# Patient Record
Sex: Female | Born: 1950 | Race: White | Hispanic: No | Marital: Married | Smoking: Former smoker
Health system: Southern US, Community
[De-identification: ages and names within clinical notes are randomized; demographics above are authoritative.]

## PROBLEM LIST (undated history)

## (undated) DIAGNOSIS — F32A Depression, unspecified: Secondary | ICD-10-CM

## (undated) DIAGNOSIS — D151 Benign neoplasm of heart: Secondary | ICD-10-CM

## (undated) DIAGNOSIS — K219 Gastro-esophageal reflux disease without esophagitis: Secondary | ICD-10-CM

## (undated) DIAGNOSIS — E119 Type 2 diabetes mellitus without complications: Secondary | ICD-10-CM

## (undated) DIAGNOSIS — I2781 Cor pulmonale (chronic): Secondary | ICD-10-CM

## (undated) DIAGNOSIS — E039 Hypothyroidism, unspecified: Secondary | ICD-10-CM

## (undated) DIAGNOSIS — F419 Anxiety disorder, unspecified: Secondary | ICD-10-CM

## (undated) DIAGNOSIS — G4733 Obstructive sleep apnea (adult) (pediatric): Secondary | ICD-10-CM

## (undated) DIAGNOSIS — J449 Chronic obstructive pulmonary disease, unspecified: Secondary | ICD-10-CM

## (undated) DIAGNOSIS — M25511 Pain in right shoulder: Secondary | ICD-10-CM

## (undated) DIAGNOSIS — I10 Essential (primary) hypertension: Secondary | ICD-10-CM

## (undated) DIAGNOSIS — F329 Major depressive disorder, single episode, unspecified: Secondary | ICD-10-CM

## (undated) DIAGNOSIS — E785 Hyperlipidemia, unspecified: Secondary | ICD-10-CM

## (undated) HISTORY — PX: KNEE ARTHROSCOPY: SUR90

## (undated) HISTORY — DX: Hypothyroidism, unspecified: E03.9

## (undated) HISTORY — DX: Major depressive disorder, single episode, unspecified: F32.9

## (undated) HISTORY — DX: Depression, unspecified: F32.A

## (undated) HISTORY — DX: Pain in right shoulder: M25.511

## (undated) HISTORY — DX: Cor pulmonale (chronic): I27.81

## (undated) HISTORY — PX: VESICOVAGINAL FISTULA CLOSURE W/ TAH: SUR271

## (undated) HISTORY — DX: Essential (primary) hypertension: I10

## (undated) HISTORY — DX: Hyperlipidemia, unspecified: E78.5

## (undated) HISTORY — DX: Chronic obstructive pulmonary disease, unspecified: J44.9

## (undated) HISTORY — DX: Obstructive sleep apnea (adult) (pediatric): G47.33

## (undated) HISTORY — DX: Morbid (severe) obesity due to excess calories: E66.01

## (undated) HISTORY — DX: Gastro-esophageal reflux disease without esophagitis: K21.9

## (undated) HISTORY — DX: Anxiety disorder, unspecified: F41.9

## (undated) HISTORY — DX: Benign neoplasm of heart: D15.1

## (undated) HISTORY — DX: Type 2 diabetes mellitus without complications: E11.9

---

## 2004-05-09 ENCOUNTER — Ambulatory Visit: Payer: Self-pay | Admitting: Family Medicine

## 2004-06-06 ENCOUNTER — Ambulatory Visit: Payer: Self-pay | Admitting: Family Medicine

## 2005-02-07 ENCOUNTER — Observation Stay (HOSPITAL_COMMUNITY): Admission: RE | Admit: 2005-02-07 | Discharge: 2005-02-08 | Payer: Self-pay | Admitting: Orthopedic Surgery

## 2005-03-28 ENCOUNTER — Ambulatory Visit: Payer: Self-pay | Admitting: Family Medicine

## 2005-04-10 ENCOUNTER — Ambulatory Visit: Payer: Self-pay | Admitting: Family Medicine

## 2005-06-04 ENCOUNTER — Ambulatory Visit: Payer: Self-pay | Admitting: Family Medicine

## 2005-07-04 ENCOUNTER — Ambulatory Visit: Payer: Self-pay | Admitting: Family Medicine

## 2006-05-08 ENCOUNTER — Ambulatory Visit: Payer: Self-pay | Admitting: Family Medicine

## 2006-06-26 ENCOUNTER — Ambulatory Visit: Payer: Self-pay | Admitting: Family Medicine

## 2006-09-11 ENCOUNTER — Ambulatory Visit: Payer: Self-pay | Admitting: Family Medicine

## 2006-10-02 ENCOUNTER — Ambulatory Visit: Payer: Self-pay | Admitting: Family Medicine

## 2006-11-28 ENCOUNTER — Ambulatory Visit: Payer: Self-pay | Admitting: Critical Care Medicine

## 2006-12-26 ENCOUNTER — Ambulatory Visit: Payer: Self-pay | Admitting: Family Medicine

## 2007-10-22 ENCOUNTER — Ambulatory Visit: Payer: Self-pay | Admitting: Cardiology

## 2007-11-18 ENCOUNTER — Ambulatory Visit: Payer: Self-pay | Admitting: Cardiology

## 2008-01-13 ENCOUNTER — Ambulatory Visit: Payer: Self-pay | Admitting: Cardiology

## 2008-01-21 ENCOUNTER — Ambulatory Visit: Payer: Self-pay | Admitting: Cardiovascular Disease

## 2008-01-21 ENCOUNTER — Ambulatory Visit (HOSPITAL_COMMUNITY): Admission: RE | Admit: 2008-01-21 | Discharge: 2008-01-21 | Payer: Self-pay | Admitting: Cardiology

## 2008-03-29 ENCOUNTER — Ambulatory Visit: Payer: Self-pay | Admitting: Cardiology

## 2008-03-30 ENCOUNTER — Encounter: Payer: Self-pay | Admitting: Cardiology

## 2008-03-31 ENCOUNTER — Encounter: Payer: Self-pay | Admitting: Cardiology

## 2008-04-02 ENCOUNTER — Inpatient Hospital Stay (HOSPITAL_COMMUNITY): Admission: AD | Admit: 2008-04-02 | Discharge: 2008-04-09 | Payer: Self-pay | Admitting: Cardiology

## 2008-04-02 ENCOUNTER — Ambulatory Visit: Payer: Self-pay | Admitting: Pulmonary Disease

## 2008-04-28 ENCOUNTER — Ambulatory Visit: Payer: Self-pay | Admitting: Pulmonary Disease

## 2008-04-28 DIAGNOSIS — F17200 Nicotine dependence, unspecified, uncomplicated: Secondary | ICD-10-CM

## 2008-04-28 DIAGNOSIS — J4489 Other specified chronic obstructive pulmonary disease: Secondary | ICD-10-CM | POA: Insufficient documentation

## 2008-04-28 DIAGNOSIS — G4730 Sleep apnea, unspecified: Secondary | ICD-10-CM | POA: Insufficient documentation

## 2008-04-28 DIAGNOSIS — L02619 Cutaneous abscess of unspecified foot: Secondary | ICD-10-CM

## 2008-04-28 DIAGNOSIS — E039 Hypothyroidism, unspecified: Secondary | ICD-10-CM | POA: Insufficient documentation

## 2008-04-28 DIAGNOSIS — J449 Chronic obstructive pulmonary disease, unspecified: Secondary | ICD-10-CM | POA: Insufficient documentation

## 2008-04-28 DIAGNOSIS — L03119 Cellulitis of unspecified part of limb: Secondary | ICD-10-CM

## 2008-04-28 DIAGNOSIS — I279 Pulmonary heart disease, unspecified: Secondary | ICD-10-CM | POA: Insufficient documentation

## 2008-05-11 ENCOUNTER — Ambulatory Visit: Payer: Self-pay | Admitting: Cardiology

## 2008-06-23 ENCOUNTER — Encounter: Payer: Self-pay | Admitting: Cardiology

## 2008-09-20 ENCOUNTER — Encounter: Payer: Self-pay | Admitting: Pulmonary Disease

## 2008-10-13 ENCOUNTER — Ambulatory Visit: Payer: Self-pay | Admitting: Cardiology

## 2008-11-02 ENCOUNTER — Ambulatory Visit: Payer: Self-pay | Admitting: Pulmonary Disease

## 2008-11-03 LAB — CONVERTED CEMR LAB: A-1 Antitrypsin, Ser: 174 mg/dL (ref 83–200)

## 2009-03-01 ENCOUNTER — Ambulatory Visit: Payer: Self-pay | Admitting: Pulmonary Disease

## 2009-03-30 DIAGNOSIS — I10 Essential (primary) hypertension: Secondary | ICD-10-CM | POA: Insufficient documentation

## 2009-07-27 LAB — HM DEXA SCAN

## 2009-12-08 ENCOUNTER — Ambulatory Visit: Payer: Self-pay | Admitting: Cardiology

## 2009-12-08 DIAGNOSIS — D151 Benign neoplasm of heart: Secondary | ICD-10-CM

## 2009-12-19 ENCOUNTER — Encounter: Payer: Self-pay | Admitting: Pulmonary Disease

## 2010-02-02 ENCOUNTER — Ambulatory Visit: Payer: Self-pay | Admitting: Cardiology

## 2010-02-02 ENCOUNTER — Encounter: Payer: Self-pay | Admitting: Cardiology

## 2010-08-08 NOTE — Letter (Signed)
SummaryScience writer Pulmonary Care Appointment Letter  [facility] Pulmonary  [address]   [place], [place] [address]   Phone: [phone]  Fax: [phone]    12/19/2009 MRN: [id number]  [name] [name] [address] [place], [place]  [address]  Dear Ms. [name],   Our office is attempting to contact you about an appointment.  Please call our office at [phone] to schedule this appointment with Dr.Alva.  Our registration staff is prepared to assist you with any questions you may have.    Thank you,   Nature conservation officer Pulmonary Division

## 2010-08-08 NOTE — Assessment & Plan Note (Signed)
Summary: 1 YR FU PER PATIENT  Medications Added ATACAND HCT 16-12.5 MG TABS (CANDESARTAN CILEXETIL-HCTZ) take 1 tab daily LISINOPRIL 20 MG TABS (LISINOPRIL) take 1 tab daily BYSTOLIC 10 MG TABS (NEBIVOLOL HCL) take 1 tab daily BUPROPION HCL 100 MG TABS (BUPROPION HCL) take 1 tab daily CRESTOR 10 MG TABS (ROSUVASTATIN CALCIUM) take 1 tab daily GABAPENTIN 100 MG CAPS (GABAPENTIN) take 1 tab three times a day VITAMIN D 400 UNIT CAPS (CHOLECALCIFEROL) take 1 tab daily ZYRTEC ALLERGY 10 MG CAPS (CETIRIZINE HCL) prn ADVIL 200 MG TABS (IBUPROFEN) prn ALKA-SELTZER ANTACID 500 MG CAPS (CALCIUM CARBONATE ANTACID) prn ASPIRIN 81 MG TBEC (ASPIRIN) Take one tablet by mouth daily      Allergies Added: NKDA  Visit Type:  Follow-up Referring Provider:  Dr. Cyril Mourning Primary Provider:  Elmon Else, PA-C   History of Present Illness: 60 year old woman presents for followup. She saw Mr. Shara Blazing back in April of 2010. Her history is reviewed below. She is followed by our Pulmonary Division, on oxygen at home at night.  She continues to work at [employer]. She states that she is supposed to be using her oxygen with activity, although does not do this with any regularity. She is also not using CPAP at night despite obstructive sleep apnea. He has been almost 2 years and she stopped smoking.  She denies any frank chest pain. She has NYHA class 2-3 dyspnea exertion which is chronic. She denies any recent cough. She does have chronic problems with lower extremity edema likely related to her cor pulmonale. Her weight is down from 225 pounds to 209 pounds.  She did not followup for a repeat echocardiogram after her visit last year. We discussed this today.  We also discussed her taking a baby aspirin daily given her risk factor profile.    New Orders:     1)  2-D Echocardiogram (2D Echo)    Current Medications (verified): 1)  Synthroid 100 Mcg Tabs (Levothyroxine Sodium) .Marland Kitchen.. 1 Once  Daily 2)  Protonix 40 Mg Tbec (Pantoprazole Sodium) .Marland Kitchen.. 1 Once Daily 3)  Advair Diskus 250-50 Mcg/dose Misc (Fluticasone-Salmeterol) .Marland Kitchen.. 1 Puff Two Times A Day 4)  Oxygen 2 Liters .... On Exertion and With Sleep 5)  Spiriva Handihaler 18 Mcg Caps (Tiotropium Bromide Monohydrate) .... Inhale Contents of One Capsule Daily 6)  Xopenex Hfa 45 Mcg/act Aero (Levalbuterol Tartrate) .... 2 Puffs As Needed 7)  Albuterol Sulfate (2.5 Mg/45ml) 0.083% Nebu (Albuterol Sulfate) .Marland Kitchen.. 1 in Nebulizer 4 Times A Day As Needed 8)  Xopenex 0.63 Mg/35ml Nebu (Levalbuterol Hcl) .... Four Times A Day 9)  Flonase 50 Mcg/act Susp (Fluticasone Propionate) .... 2 Sprays A Day As Needed 10)  Zoloft 25 Mg Tabs (Sertraline Hcl) .... Once Daily 11)  Savella 50 Mg Tabs (Milnacipran Hcl) .... Take 1 Tablet By Mouth Two Times A Day 12)  Furosemide 40 Mg Tabs (Furosemide) .... Take 1 Tablet By Mouth Once A Day 13)  Atacand Hct 16-12.5 Mg Tabs (Candesartan Cilexetil-Hctz) .... Take 1 Tab Daily 14)  Lisinopril 20 Mg Tabs (Lisinopril) .... Take 1 Tab Daily 15)  Bystolic 10 Mg Tabs (Nebivolol Hcl) .... Take 1 Tab Daily 16)  Bupropion Hcl 100 Mg Tabs (Bupropion Hcl) .... Take 1 Tab Daily 17)  Crestor 10 Mg Tabs (Rosuvastatin Calcium) .... Take 1 Tab Daily 18)  Gabapentin 100 Mg Caps (Gabapentin) .... Take 1 Tab Three Times A Day 19)  Vitamin D 400 Unit Caps (Cholecalciferol) .... Take 1 Tab  Daily 20)  Zyrtec Allergy 10 Mg Caps (Cetirizine Hcl) .... Prn 21)  Advil 200 Mg Tabs (Ibuprofen) .... Prn 22)  Alka-Seltzer Antacid 500 Mg Caps (Calcium Carbonate Antacid) .... Prn 23)  Aspirin 81 Mg Tbec (Aspirin) .... 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  Quit in September 2009.  Smoked 2 ppd "on and off" since age 37. Quit ETOH September 2009 Positive history of passive tobacco smoke exposure  Review of Systems       The patient complains of dyspnea on exertion and peripheral edema.  The patient denies anorexia, fever, weight gain, chest pain, syncope, prolonged cough, hemoptysis, melena, and hematochezia.         She reports an episode approximately 2 months ago that occurred when she was very busy at work. She describes having trouble with her speech and thought process at that time, although symptoms cleared up around evaluation by EMS. She did not have any further hospital evaluation. She was told that this may have been related to "anxiety." Since then she has had no similar events, no focal motor deficits. She was not wearing her oxygen at the time.  Vital Signs:  Patient profile:   60 year old female Weight:      209 pounds BMI:     36.57 Pulse rate:   99 / minute BP sitting:   153 / 98  (right arm)  Vitals Entered By: Dreama Saa, CNA (December 08, 2009 8:12 AM)  Physical Exam  Additional Exam:  Obese woman in no acute distress, not wearing oxygen. HEENT: Conjunctiva and lids normal, oropharynx with poor dentition. Neck: Supple, no carotid bruits, no elevated JVP. Lungs: Diminished breath sounds, nonlabored, no wheezing. Cardiac: Distant, regular heart sounds, no S3. Abdomen: Obese, nontender, bowel sounds present. Skin: Warm and dry. Extremities: Chronic appearing edema and venous stasis below the knees, distal pulses 1-2+. Musculoskeletal: No gross deformities. Neuropsychiatric: Alert and oriented x3, affect grossly appropriate.   EKG  Procedure date:  12/08/2009  Findings:      Sinus rhythm at 92 beats per minute with decreased R-wave progression and single PVC.  Prior Report Reviewed for MRI EXAM:  Findings: 01/27/2008 Cardiac MRI:  Final impression 1.  6 x 9 mm sessile mass in the  atrial septum.  This is most consistent with a benign fibroelastoma or papilloma. 2.  Mild left atrial enlargement. 3.  No ASD and VSD. 4.  Normal left ventricle ejection fraction 54%  Prior Report Reviewed for TE Echocardiogram:  Findings: 03/31/2008 LVEF 55-60%, severe RV dilatation, severe RV dysfunction,  moderate to severe RAE, no ASD with intact atrial septum, no intrapulmonary shunting, small papilloma or fibroelastoma on crux of atrial septum (no definate myxoma).  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 A followup echocardiogram will be arranged. Of note, the patient had no evidence of ASD or other intracardiac shunting during previous evaluation.  Orders: 2-D Echocardiogram (2D Echo)  Problem # 2:  COR PULMONALE (ICD-416.9)  Prior evidence of right ventricular dilatation with severely decreased function in the setting of COPD and obstructive sleep  apnea. Patient is followed by our pulmonary division, Dr. Vassie Loll. She is due for a followup visit. Can forward copy of her repeat echocardiogram for documentation of right-sided function, and pulmonary pressures. This may be helpful in her future management. She continues on Lasix for management of her lower extremity edema which has been chronic.  Orders: 2-D Echocardiogram (2D Echo)  Problem # 3:  HYPERTENSION, UNSPECIFIED (ICD-401.9)  Followed by Mr. Izola Price. Blood pressure is elevated today. If this trend continues, will likely need further advancement in present medications. Norvasc would also be a potential consideration if needed.  The following medications were removed from the medication list:    Avalide 150-12.5 Mg Tabs (Irbesartan-hydrochlorothiazide) .Marland Kitchen... 1 once daily Her updated medication list for this problem includes:    Furosemide 40 Mg Tabs  (Furosemide) .Marland Kitchen... Take 1 tablet by mouth once a day    Atacand Hct 16-12.5 Mg Tabs (Candesartan cilexetil-hctz) .Marland Kitchen... Take 1 tab daily    Lisinopril 20 Mg Tabs (Lisinopril) .Marland Kitchen... Take 1 tab daily    Bystolic 10 Mg Tabs (Nebivolol hcl) .Marland Kitchen... Take 1 tab daily    Aspirin 81 Mg Tbec (Aspirin) .Marland Kitchen... Take one tablet by mouth daily  Patient Instructions: 1)  Your physician wants you to follow-up in: 6 months. You will receive a reminder letter in the mail one-two months in advance. If you don't receive a letter, please call our office to schedule the follow-up appointment. 2)  Your physician has requested that you have an echocardiogram.  Echocardiography is a painless test that uses sound waves to create images of your heart. It provides your doctor with information about the size and shape of your heart and how well your heart's chambers and valves are working.  This procedure takes approximately one hour. There are no restrictions for this procedure. If the results of your test are normal or stable, you will receive a letter. If they are abnormal, the nurse will contact you by phone.  3)  Your physician discussed the risks, benefits and indications for preventive aspirin therapy. It is recommended that you start (or continue) taking 81 mg of aspirin a day.

## 2010-08-14 ENCOUNTER — Emergency Department (HOSPITAL_COMMUNITY): Payer: PRIVATE HEALTH INSURANCE

## 2010-08-14 ENCOUNTER — Inpatient Hospital Stay (HOSPITAL_COMMUNITY)
Admission: EM | Admit: 2010-08-14 | Discharge: 2010-08-21 | DRG: 193 | Disposition: A | Discharge status: Home / Self Care | Payer: PRIVATE HEALTH INSURANCE | Attending: Internal Medicine | Admitting: Internal Medicine

## 2010-08-14 DIAGNOSIS — IMO0001 Reserved for inherently not codable concepts without codable children: Secondary | ICD-10-CM | POA: Diagnosis present

## 2010-08-14 DIAGNOSIS — G4733 Obstructive sleep apnea (adult) (pediatric): Secondary | ICD-10-CM | POA: Diagnosis present

## 2010-08-14 DIAGNOSIS — J383 Other diseases of vocal cords: Secondary | ICD-10-CM | POA: Diagnosis present

## 2010-08-14 DIAGNOSIS — Z9119 Patient's noncompliance with other medical treatment and regimen: Secondary | ICD-10-CM

## 2010-08-14 DIAGNOSIS — R0982 Postnasal drip: Secondary | ICD-10-CM | POA: Diagnosis present

## 2010-08-14 DIAGNOSIS — E039 Hypothyroidism, unspecified: Secondary | ICD-10-CM | POA: Diagnosis present

## 2010-08-14 DIAGNOSIS — Z9981 Dependence on supplemental oxygen: Secondary | ICD-10-CM

## 2010-08-14 DIAGNOSIS — J189 Pneumonia, unspecified organism: Principal | ICD-10-CM | POA: Diagnosis present

## 2010-08-14 DIAGNOSIS — Z91199 Patient's noncompliance with other medical treatment and regimen due to unspecified reason: Secondary | ICD-10-CM

## 2010-08-14 DIAGNOSIS — J9620 Acute and chronic respiratory failure, unspecified whether with hypoxia or hypercapnia: Secondary | ICD-10-CM | POA: Diagnosis not present

## 2010-08-14 DIAGNOSIS — J310 Chronic rhinitis: Secondary | ICD-10-CM | POA: Diagnosis present

## 2010-08-14 DIAGNOSIS — J441 Chronic obstructive pulmonary disease with (acute) exacerbation: Secondary | ICD-10-CM | POA: Diagnosis present

## 2010-08-14 LAB — CBC
HCT: 41.8 % (ref 36.0–46.0)
Hemoglobin: 13.8 g/dL (ref 12.0–15.0)
MCHC: 33 g/dL (ref 30.0–36.0)
RBC: 4.78 MIL/uL (ref 3.87–5.11)

## 2010-08-14 LAB — BASIC METABOLIC PANEL
BUN: 7 mg/dL (ref 6–23)
CO2: 34 mEq/L — ABNORMAL HIGH (ref 19–32)
Calcium: 9.4 mg/dL (ref 8.4–10.5)
GFR calc Af Amer: >60 mL/min (ref >60)
Sodium: 141 mEq/L (ref 135–145)

## 2010-08-14 LAB — DIFFERENTIAL
Basophils Absolute: 0 10*3/uL (ref 0.0–0.1)
Lymphs Abs: 0.6 10*3/uL — ABNORMAL LOW (ref 0.7–4.0)
Neutrophils Relative %: 83 % — ABNORMAL HIGH (ref 43–77)

## 2010-08-14 LAB — GLUCOSE, CAPILLARY

## 2010-08-15 LAB — GLUCOSE, CAPILLARY
Glucose-Capillary: 270 mg/dL — ABNORMAL HIGH (ref 70–99)
Glucose-Capillary: 369 mg/dL — ABNORMAL HIGH (ref 70–99)

## 2010-08-15 LAB — EXPECTORATED SPUTUM ASSESSMENT W GRAM STAIN, RFLX TO RESP C

## 2010-08-15 LAB — BASIC METABOLIC PANEL
BUN: 11 mg/dL (ref 6–23)
Chloride: 97 mEq/L (ref 96–112)
GFR calc Af Amer: >60 mL/min (ref >60)
GFR calc non Af Amer: >60 mL/min (ref >60)
Glucose, Bld: 265 mg/dL — ABNORMAL HIGH (ref 70–99)
Potassium: 4 mEq/L (ref 3.5–5.1)

## 2010-08-15 LAB — CBC
Hemoglobin: 13.2 g/dL (ref 12.0–15.0)
MCHC: 32.8 g/dL (ref 30.0–36.0)
MCV: 88.2 fL (ref 78.0–100.0)
Platelets: 164 10*3/uL (ref 150–400)
RDW: 14 % (ref 11.5–15.5)
WBC: 5.8 10*3/uL (ref 4.0–10.5)

## 2010-08-16 LAB — BASIC METABOLIC PANEL
Calcium: 9.3 mg/dL (ref 8.4–10.5)
Creatinine, Ser: 0.52 mg/dL (ref 0.4–1.2)
GFR calc Af Amer: >60 mL/min (ref >60)
GFR calc non Af Amer: >60 mL/min (ref >60)
Sodium: 140 mEq/L (ref 135–145)

## 2010-08-16 LAB — GLUCOSE, CAPILLARY
Glucose-Capillary: 333 mg/dL — ABNORMAL HIGH (ref 70–99)
Glucose-Capillary: 315 mg/dL — ABNORMAL HIGH (ref 70–99)
Glucose-Capillary: 340 mg/dL — ABNORMAL HIGH (ref 70–99)

## 2010-08-16 LAB — CBC
MCH: 28.6 pg (ref 26.0–34.0)
MCHC: 32.6 g/dL (ref 30.0–36.0)
Platelets: 155 10*3/uL (ref 150–400)
RDW: 14 % (ref 11.5–15.5)

## 2010-08-17 LAB — CULTURE, RESPIRATORY W GRAM STAIN

## 2010-08-17 LAB — GLUCOSE, CAPILLARY
Glucose-Capillary: 261 mg/dL — ABNORMAL HIGH (ref 70–99)
Glucose-Capillary: 206 mg/dL — ABNORMAL HIGH (ref 70–99)

## 2010-08-18 LAB — GLUCOSE, CAPILLARY
Glucose-Capillary: 84 mg/dL (ref 70–99)
Glucose-Capillary: 289 mg/dL — ABNORMAL HIGH (ref 70–99)

## 2010-08-18 LAB — BASIC METABOLIC PANEL
CO2: 38 mEq/L — ABNORMAL HIGH (ref 19–32)
Calcium: 9.5 mg/dL (ref 8.4–10.5)
Creatinine, Ser: 0.55 mg/dL (ref 0.4–1.2)
GFR calc Af Amer: >60 mL/min (ref >60)
GFR calc non Af Amer: >60 mL/min (ref >60)
Sodium: 143 mEq/L (ref 135–145)

## 2010-08-18 LAB — BRAIN NATRIURETIC PEPTIDE: Pro B Natriuretic peptide (BNP): 64.1 pg/mL (ref 0.0–100.0)

## 2010-08-19 DIAGNOSIS — J441 Chronic obstructive pulmonary disease with (acute) exacerbation: Secondary | ICD-10-CM

## 2010-08-19 LAB — BASIC METABOLIC PANEL
CO2: 44 mEq/L (ref 19–32)
Calcium: 9.4 mg/dL (ref 8.4–10.5)
Creatinine, Ser: 0.55 mg/dL (ref 0.4–1.2)
GFR calc Af Amer: >60 mL/min (ref >60)
GFR calc non Af Amer: >60 mL/min (ref >60)
Sodium: 144 mEq/L (ref 135–145)

## 2010-08-19 LAB — GLUCOSE, CAPILLARY
Glucose-Capillary: 109 mg/dL — ABNORMAL HIGH (ref 70–99)
Glucose-Capillary: 143 mg/dL — ABNORMAL HIGH (ref 70–99)

## 2010-08-20 ENCOUNTER — Inpatient Hospital Stay (HOSPITAL_COMMUNITY): Payer: PRIVATE HEALTH INSURANCE

## 2010-08-20 LAB — GLUCOSE, CAPILLARY
Glucose-Capillary: 114 mg/dL — ABNORMAL HIGH (ref 70–99)
Glucose-Capillary: 365 mg/dL — ABNORMAL HIGH (ref 70–99)

## 2010-08-21 DIAGNOSIS — R05 Cough: Secondary | ICD-10-CM

## 2010-08-21 LAB — CULTURE, BLOOD (ROUTINE X 2)
Culture  Setup Time: 201202070121
Culture: NO GROWTH

## 2010-08-21 LAB — GLUCOSE, CAPILLARY
Glucose-Capillary: 90 mg/dL (ref 70–99)
Glucose-Capillary: 319 mg/dL — ABNORMAL HIGH (ref 70–99)

## 2010-08-22 NOTE — Consult Note (Addendum)
NAMEASIANAE, MINKLER NO.:  [id number]  MEDICAL RECORD NO.:  [id number]           PATIENT TYPE:  I  LOCATION:  1439                         FACILITY:  [facility]  PHYSICIAN:  Coralyn Helling, MD        DATE OF BIRTH:  1951/01/30  DATE OF CONSULTATION:  08/19/2010 DATE OF DISCHARGE:                                CONSULTATION   PRIMARY CARE PHYSICIAN:  Ernestina Penna, M.D.  PULMONOLOGIST:  Oretha Milch, M.D.  CHIEF COMPLAINT:  Shortness of breath.  REASON FOR CONSULTATION:  Acute chronic obstructive pulmonary disease exacerbation.  HISTORY OF PRESENT ILLNESS:  [name] is a 59-year female who has a history of severe COPD, chronic hypoxemic respiratory failure, cor pulmonale, and obstructive sleep apnea.  She is developing worsening cough with thick yellowish sputum 2-3 days prior to admission.  This was associated with sinus congestion and postnasal drip.  She is getting progressively short of breath.  She presented to her primary care physician's office, was noted to have an oxygen saturation of 70%, and chest x-ray at that time showed a possible right middle lung infiltrate. As a result, she was advised to go to [facility] for hospital admission.  Since being admitted, she has some improvement.  She still has some cough with sputum reduction but this is decreasing.  She does continue to have sinus congestion.  She complained that Advair would cause airway irritation, and she did not like using that.  She has also noted hoarseness which has been present for quite some time.  PAST MEDICAL HISTORY: 1. Significant for COPD. 2. Chronic hypoxemic respiratory failure. 3. Cor pulmonale. 4. Obstructive sleep apnea, intolerant of CPAP. 5. Hypothyroidism. 6. Diabetes mellitus. 7. Hypertension.  PAST SURGICAL HISTORY: 1. Knee arthroscopy. 2. Hysterectomy.  ALLERGIES:  She has no known drug allergies.  OUTPATIENT MEDICATIONS: 1. Kombiglyze XR  11/998 mg. 2. Lisinopril 20 mg. 3. Atacand. 4. Bystolic. 5. Synthroid 100 mcg. 6. Protonix 40 mg. 7. Crestor 10 mg. 8. Bupropion 100 mg. 9. Lorazepam 0.5 mg. 10.Neurontin 100 mg t.i.d. 11.Advair. 12.Spiriva. 13.Home nebulizer. 14.She is on 2 L to 3 L of oxygen daily.  INPATIENT MEDICATIONS: 1. Crestor 10 mg daily. 2. Neurontin 100 mg t.i.d. 3. Prednisone 60 mg daily. 4. Lisinopril 20 mg daily. 5. Levothyroxine 100 mcg daily. 6. Protonix 40 mg daily. 7. Aspirin 81 mg daily. 8. Ventolin 2.5 mg nebulizer q.6h. 9. Heparin 5000 units subcutaneous q.8h. 10.Sliding scale insulin. 11.Diltiazem 60 mg q.12h. 12.Metformin 1000 mg daily. 13.Spiriva 1 puff daily. 14.Mucinex 600 mg b.i.d. 15.Lantus insulin 30 units at bedtime. 16.Benicar 40 mg daily. 17.Hydrochlorothiazide 12.5 mg daily. 18.Avelox 400 mg daily.  FAMILY HISTORY:  Significant for her brother who had COPD.  Father had COPD and heart disease and mother had COPD.  SOCIAL HISTORY:  She is married.  She works as a Event organiser.  She has 1 child.  She quit smoking and drinking in 2009.  REVIEW OF SYSTEMS:  A 12-point review of systems is unremarkable except for stated above.  PHYSICAL EXAMINATION:  GENERAL:  She is seen in her hospital room.  She is awake and alert, does not appear to be in acute distress.  She does have somewhat hoarse quality to her voice. VITAL SIGNS:  Temperature 98.7, blood pressure 167/95, heart rate is 69, respiratory rate 20, and oxygen saturation is 98% on 2 L. HEENT:  Pupils reactive.  Extraocular movements are intact.  She has no sinus tenderness.  She has clear nasal discharge.  She has narrow nasal angles.  There is mild erythema of the posterior pharynx.  There is no oral exudate.  There is no lymphadenopathy. HEART:  S1-S2, regular rhythm.  No murmur. CHEST:  She had diminished breath sounds but there is no wheezing or rales. ABDOMEN:  Obese, soft, and nontender.  Positive bowel  sounds. EXTREMITIES:  There is no edema, cyanosis, or clubbing. NEUROLOGIC:  Cranial nerves II-XII were intact, and no focal deficits were appreciated.  She had normal strength.  IMAGING STUDIES:  Chest x-ray from August 14, 2010, showed possible right middle lung infiltrate versus atelectasis.  LABORATORY DATA:  Sodium 144, potassium 3.7, chloride 93, CO2 is 44, BUN is 10, and creatinine 0.5.  WBC is 8.1, hemoglobin is 12.6, and hematocrit is 38.7.  Platelet count is 155,000.  BNP is 67.  IMPRESSION AND PLAN: 1. Acute chronic obstructive pulmonary disease exacerbation.  She is     slowly improving.  I would continue to wean her prednisone.  I will     continue her on Spiriva and scheduled Ventolin.  I would avoid     restarting Advair for the time being due to concerns for upper     airway irritation.  She is to continue on Avelox per the     hospitalist team. 2. Atelectasis versus infiltrate on chest x-ray from August 14, 2010.  I will repeat her chest x-ray to determine if she has     clearing of this abnormality. 3. Possible vocal cord dysfunction with angiotensin-converting-enzyme     inhibitor use and chronic rhinitis with postnasal drip.  I will     stop her lisinopril and adjust her angiotensin II receptor blocker     medication.  I will also start her on a sinus regimen with Flonase. 4. Acute on chronic hypoxic respiratory failure.  We will titrate her     oxygen, keep her oxygen saturation greater than 92%. 5. History of obstructive sleep apnea.  Again, she has been intolerant     of continuous positive airway pressure therapy in the past but this     will need to be readdressed on an outpatient basis.     Coralyn Helling, MD     VS/MEDQ  D:  08/19/2010  T:  08/19/2010  Job:  [id number]  Electronically Signed by Coralyn Helling MD on 08/22/2010 11:56:59 AM

## 2010-08-28 ENCOUNTER — Telehealth (INDEPENDENT_AMBULATORY_CARE_PROVIDER_SITE_OTHER): Payer: Self-pay | Admitting: *Deleted

## 2010-09-03 NOTE — Discharge Summary (Signed)
[name] [name], HUTT NO.:  [id number]  MEDICAL RECORD NO.:  [id number]           PATIENT TYPE:  I  LOCATION:  1439                         FACILITY:  [facility]  PHYSICIAN:  [name] Scott, MD     DATE OF BIRTH:  [birth year]  DATE OF ADMISSION:  08/14/2010 DATE OF DISCHARGE:                              DISCHARGE SUMMARY   PRIMARY CARE PHYSICIAN:  [name] [name], M.D.  PULMONOLOGIST:  [name] Milch, MD.  DISCHARGE DIAGNOSES: 1. Acute exacerbation of chronic obstructive pulmonary disease. 2. Community-acquired pneumonia. 3. Uncontrolled type 2 diabetes mellitus. 4. Hypothyroidism. 5. History of cor pulmonale. 6. Uncontrolled hypertension. 7. Acute-on-chronic hypoxic respiratory failure.  Acute respiratory     failure secondary to chronic obstructive pulmonary disease     exacerbation and chronic respiratory failure secondary to chronic     obstructive pulmonary disease and obstructive sleep apnea. 8. Obstructive sleep apnea syndrome, did not tolerate CPAP. 9. Vocal cord dysfunction. 10.Rhinitis with postnasal drip.  DISCHARGE MEDICATIONS: 1. Albuterol nebulizations 0.083%, one dose inhaled q.i.d. p.r.n. for     difficulty breathing or wheezing. 2. Albuterol HFA 2 puffs inhaled q. four hourly p.r.n. for difficulty     breathing or wheezing. 3. Amlodipine 10 mg p.o. daily. 4. Enteric-coated aspirin 81 mg p.o. daily. 5. Bisoprolol 5 mg p.o. daily. 6. Flonase 2 sprays nasally daily. 7. Mucinex 600 mg p.o. b.i.d. 8. Robitussin DM 5 mL p.o. q. four hourly p.r.n. for cough. 9. NovoLog 8 units subcutaneously t.i.d. with meals. 10.Lantus 30 units subcutaneously q.h.s. 11.Metformin XR 1000 mg p.o. daily. 12.Prednisone taper as per directions. 13.Atacand HCT 16/12.5 mg p.o. daily. 14.Ativan 0.5 mg p.o. q. eight hourly p.r.n. for anxiety. 15.Crestor 10 mg p.o. q.p.m. 16.Gabapentin 100 mg p.o. t.i.d. 17.Protonix 40 mg p.o. daily. 18.Spiriva 18 mcg inhaled  daily. 19.Synthroid 100 mcg p.o. daily. 20.Wellbutrin 100 mg p.o. q.p.m.  DISCONTINUED MEDICATIONS: 1. Kombiglyze XR. 2. Lisinopril.  IMAGING AND PROCEDURES: 1. Chest x-ray on February 12.  IMPRESSION:  Improved aeration of the     right middle lobe with persistent atelectasis in the left lung base     and in the lingula.  Background of emphysema/bronchitis. 2. Chest x-ray on February 6.  IMPRESSION:  A.  Right middle lobe and     mid lingual are air space opacity.  Although this favors     atelectasis, pneumonia could causes similar appearance.  Mild     airway thickening.  PERTINENT LABS:  Blood cultures x2 from February 6, no growth final report.  Basic metabolic panel on February 11 only significant for chloride 93, bicarbonate 44, and glucose 164, otherwise within normal limits.  BNP 64.  Sputum culture normal oropharyngeal flora.  CBC within normal limits.  Magnesium 1.9, hemoglobin A1c was 11.  CONSULTATIONS:  Pulmonary MD, [name] Helling, MD.  DIET:  Diabetic diet.  ACTIVITIES:  Increase activity gradually as tolerated.  COMPLAINTS TODAY:  None.  The patient denies any dyspnea or cough.  She has a mild headache.  PHYSICAL EXAMINATION:  GENERAL:  The patient is in no obvious distress. VITAL SIGNS:  Temperature 98.5 degrees Fahrenheit,  pulse 77 per minute, respiration 18 per minute, blood pressure 128/80 mmHg, and saturating at 93% on 3 liters of oxygen.  CBG are fluctuating but mostly in the 90 to low 100 but has had one or two readings in the low 300s. RESPIRATORY SYSTEM:  Clear, although has distant breath sounds.  No increased work of breathing. CARDIOVASCULAR SYSTEM:  First and second heart sounds heard.  Regular rate and rhythm.  No JVD. ABDOMEN:  Soft and bowel sound present. CENTRAL NERVOUS SYSTEM:  The patient is awake, alert, oriented x3 with no focal deficits. EXTREMITIES:  With grade 5/5 power.  HOSPITAL COURSE:  [name] [name] is a pleasant 60 year old  female patient with history of severe O2 dependent COPD, chronic hypoxemic respiratory failure, cor pulmonale, obstructive sleep apnea, who has not tolerated her CPAP, type 2 diabetes mellitus, hypertension, who has not been fully compliant with her medications and physician followups.  She now presented with worsening productive cough, sinus congestion, postnasal drip, and worsening dyspnea.  She was found to have O2 saturation of 70% in her primary MD's office and chest x-ray showed right middle lobe infiltrate.  She was advised to come to the hospital. 1. Acute infective exacerbation of COPD.  She was admitted to the     hospital.  She was started on IV steroids, antibiotics, oxygen, and     bronchodilator nebulizations.  She made gradual improvement, but at     one time we thought that this had plateaued and was not making as     well as recovery as expected and hence the pulmonary MD's were     consulted.  They made some changes including discontinuing her ACE     inhibitors and adding Flonase.  They have seen her today and     cleared her for discharge for outpatient follow up with her primary     pulmonary MD.  They have also advised to avoid restarting Advair.     She has completed a course of antibiotics. 2. Community-acquired pneumonia.  Treated.  Recommend followup chest x-     ray in a couple of weeks. 3. Vocal cord dysfunction.  ACE inhibitors were discontinued and for     her chronic rhinitis, Flonase was added. 4. Acute-on-chronic hypoxic respiratory failure.  She is going to be     on home oxygen. 5. History of obstructive sleep apnea.  She is intolerant of the CPAP. 6. Uncontrolled type 2 diabetes mellitus as evidenced by very high     A1c.  She has been placed on insulin and is educated regarding its     use.  With the discontinuation of steroids, this may even further     improve. 7. Hypertension.  Angiotensin-converting enzyme inhibitors have been     discontinued.   She is continued on ARBs and bisoprolol does has     been reduced and Norvasc has been added.  DISPOSITION:  The patient at this time is discharged home in stable condition.  FOLLOWUP RECOMMENDATIONS: 1. With Dr. Rudi Heap.  The patient is to call for appointment to     be seen in 5-7 days from discharge. 2. With Dr. Cyril Mourning.  The patient is to call for an appointment.  Time taken in coordinating this discharge 45 minutes.     [name] Scott, MD     AH/MEDQ  D:  08/21/2010  T:  08/21/2010  Job:  [id number]  cc:   [name] [name], M.D. Fax: [phone]  [name] Milch, MD [address] [place] [address]  Electronically Signed by [name] Scott MD on 09/03/2010 10:06:51 PM

## 2010-09-05 NOTE — H&P (Signed)
NAMECRYSTIN, [name] NO.:  [id number]  MEDICAL RECORD NO.:  [id number]           PATIENT TYPE:  E  LOCATION:  WLED                         FACILITY:  [facility]  PHYSICIAN:  Lonia Blood, M.D.DATE OF BIRTH:  [birth year]  DATE OF ADMISSION:  08/14/2010 DATE OF DISCHARGE:                             HISTORY & PHYSICAL   PRIMARY CARE PHYSICIAN:  Dr. Dorinda Hill More at [facility]  CHIEF COMPLAINT:  Shortness of breath.  HISTORY OF PRESENT ILLNESS:  [name] is a pleasant 60 year old female who is admittedly noncompliant with her typical medical therapy to include oxygen which is prescribed 24 hours a day for her known severe COPD with cor pulmonale.  She reports that approximately 3 weeks ago, she was suffering severe nasal congestion accompanied by significant postnasal drip.  Over the last week, this has worsened to the point that she began to experience significant coughing with expectoration of significant amounts of thick tenacious yellowish green sputum.  She has had occasional chills at home, which she thinks were related to fevers.  Over the last 2-3 days, she had become progressively more short of breath.  She presented to her primary care physician's office today where she was found to have an O2 sat of 70% on room air. Chest x-ray at that time also suggested a possible pneumonia.  As a result, the patient was sent to the [facility] Emergency Room for evaluation.  In the Emergency Room, an x-ray has revealed a right middle lobe and less prominent jugular opacity both consistent with community- acquired pneumonia.  The patient is suffering with some respiratory difficulty but is not in the severe distress and is improved somewhat with one-time dose of Solu-Medrol.  She denies chest pain but does admit rib type pleuritic pain bilaterally which she attributes to chronic cough.  She states that she did quit smoking  approximately a year and half ago.  As noted above, however she does not consistently use her inhalers and she tends to only use her oxygen at night when she is sleeping despite the fact that she has been told that she should wear 24 hours a day.  REVIEW OF SYSTEMS:  Comprehensive review of systems is unremarkable with exception to the positive elements noted in the history of present illness above.  PAST MEDICAL HISTORY: 1. COPD.  A.  History of admission with intubation October 2009.  B.     Cor pulmonale via echo in September 2009 neck.  C.  Oxygen-     dependent, though the patient is noncompliant. 2. Hypothyroidism. 3. Diabetes mellitus type 2. 4. Hypertension. 5. Obesity. 6. Sleep apnea. 7. Small atrial fibroelastoma.  OUTPATIENT MEDICATIONS:  A complete list of the patient's outpatient medicines is reviewed.  This list is composed by the patient and is unfortunately not complete.  The information that I am given is as follows: 1. Kombiglyze XR 5 mg/1000 mg. 2. Lisinopril 20 mg. 3. Atacand HCT. 4. Bystolic. 5. Synthroid 100 mcg. 6. Protonix 40 mg. 7. Crestor 10 mg. 8. Bupropion 100 mg. 9. Lorazepam 0.5 mg. 10.Neurontin 100 mg t.i.d. 11.Advair.  12.Spiriva. 13."Neb." 14.Oxygen 2 per minute nasal cannula when sleeping and 3 liters when     awake per her report.  ALLERGIES:  No known drug allergies.  FAMILY HISTORY:  Reviewed with the patient but noncontributory to this admission.  SOCIAL HISTORY:  The patient is married.  She lives with her husband. She works as a Solicitor at [employer].  She has one healthy grown child.  She is no longer smoking tobacco.  DATA REVIEW:  CBC is actually unremarkable.  Sodium, potassium chloride, BUN, creatinine are normal.  Bicarb is elevated 34.  Serum glucose is elevated 164.  Calcium is normal.  Chest x-ray reveals right middle lobe and lingular opacities consistent with an infiltrate.  PHYSICAL EXAMINATION:  VITAL  SIGNS:  Temperature 98.3, blood pressure 163/93, heart rate 112, respiratory 24, O2 93% on 3 L per minute nasal cannula and 70% on room air. GENERAL:  Obese female in mild respiratory distress but able to complete full sentences without pausing to breathe. HEENT:  Normocephalic, atraumatic.  Pupils equal, round, and react to light and accommodation.  Extraocular muscles intact bilaterally.  OC/OP clear. NECK:  No lymphadenopathy. LUNGS:  Poor air movement throughout all fields with decreased breath sounds diffusely and mild expiratory wheezing in all auscultated fields with no focal crackles appreciated but this is likely due to very poor air movement. CARDIOVASCULAR:  Distant heart sounds but regular rate and rhythm without gallop or rub. ABDOMEN:  Overweight, soft, bowel sounds present.  No organomegaly.  No rebound or ascites. EXTREMITIES:  1+ bilateral lower extremity edema without cyanosis or clubbing. NEUROLOGIC:  Alert and oriented x4.  Cranial II-XII intact bilaterally, 5/5 strength in bilateral upper and lower extremities.  Intact sensation to touch throughout.  IMPRESSION AND PLAN: 1. Acute exacerbation of Chronic obstructive pulmonary disease - this     is likely due to a combination of her community-acquired pneumonia     and probably an acute on chronic bronchitis.  The patient will be     admitted to the acute unit.  We will monitor her on telemetry.  At     the present time, her respiratory status is stable enough, we do     not feel the step-down unit is required.  She will be dosed with     full-dose Solu-Medrol to be titrated to p.o. prednisone.  She will     be given scheduled albuterol and Atrovent nebs.  At tis time that     her steroids are being weaned, we will initiate a steroid inhaler.     The patient will also be administered oxygen 24x7 and this will be     titrated to the appropriate O2 saturation.  I have counseled the     patient on the absolute need  for very strict adherence to her     medication and oxygen regimen.  I have advised her to followup with     Dr. Britt Bottom, whom she has seen in the past in the outpatient setting,     would be very important after the time of her discharge.  We will     attempt to assist her in arranging this appointment. 2. Community-acquired pneumonia - the patient's chest x-ray does in     fact suggest a right middle lobe and lingular infiltrate.  We will     treat the patient with empiric antibiotic therapy.  Sputum culture     will be obtained. 3. Acute bronchitis -  the patient describes symptoms prior to her     severe symptoms that were suggestive of an acute bronchitis.  She     likely had an acute sinusitis 3 weeks ago at the onset of this     sick.  The patient's acute bronchitis will be adequately treated     with the same antibiotics that will be used for her community-     acquired pneumonia along with her inhaler therapies. 4. Cor pulmonale - this diagnosis was made via echocardiogram in     September 2009.  It is not clear to me if followup echo has been     accomplished since that time.  At present, however, the patient     does not appear to be significantly volume overloaded and this     appears to be reasonably well compensated.  We will simply follow     the patient clinically for now.  Should she develop volume related     issues, we will consider an echo during her hospital stay. 5. Diabetes mellitus type 2 - this appears to be poorly controlled as     evidenced by her serum glucose of 164.  We will continue her oral     therapy.  Will restrict her to a carb modified median diet.  We     will administer sliding-scale insulin in anticipation that her CBG     will climb higher in the setting of systemic steroid therapy. 6. Hypothyroidism - we will continue the patient's Synthroid.  In this     acute setting, I do not feel that a TSH would likely be reliable     and therefore I will not  check it. 7. Hypertension - we will continue the patient's ACE inhibitor for     now.  I do not wish to dose her with both ACE and ARB while she is     sick of concern for renal insufficiency.  We will follow the     patient's blood pressure trend and add other medications as needed.     I will hold her Bystolic for now due to her bronchospasm.     Lonia Blood, M.D.     JTM/MEDQ  D:  08/14/2010  T:  08/14/2010  Job:  [id number]  cc:   Ernestina Penna, M.D. Fax: [phone]  Electronically Signed by Jetty Duhamel M.D. on 09/05/2010 06:06:32 PM

## 2010-09-05 NOTE — Progress Notes (Signed)
Summary: O2 - requesting to switch DME  Phone Note Call from Patient Call back at Home Phone [phone]   Caller: Patient Call For: parrett Summary of Call: pt wants to speak to tp re: O2. pt has an rov w/ dr Vassie Loll 09/07/10 but wants to speak to tp.  Initial call taken by: Tivis Ringer, CNA,  August 28, 2010 10:05 AM  Follow-up for Phone Call        Spoke with pt.  She states that she was recently seen in the hospital by RA. Has pending followup on 09/07/10.  She is requesting to switch from [facility] to [place], and wants new order for portable o2 through [facility]. Pls advise if okay to send order.  Follow-up by: Vernie Murders,  August 28, 2010 12:18 PM  Additional Follow-up for Phone Call Additional follow up Details #1::        OK Additional Follow-up by: Comer Locket. Vassie Loll MD,  August 28, 2010 12:40 PM    Additional Follow-up for Phone Call Additional follow up Details #2::    Called, spoke with pt.  She is aware order placed to have o2 switched from [facility] to [facility].  Pt advised not have [facility] pick up o2 until [facility] has delivered o2.  She verbalized understanding of this.  Follow-up by: Gweneth Dimitri RN,  August 28, 2010 12:48 PM

## 2010-09-07 ENCOUNTER — Ambulatory Visit: Payer: PRIVATE HEALTH INSURANCE | Admitting: Pulmonary Disease

## 2010-10-04 ENCOUNTER — Ambulatory Visit: Payer: PRIVATE HEALTH INSURANCE | Admitting: Pulmonary Disease

## 2010-10-26 ENCOUNTER — Other Ambulatory Visit: Payer: Self-pay | Admitting: Orthopedic Surgery

## 2010-10-26 DIAGNOSIS — M25511 Pain in right shoulder: Secondary | ICD-10-CM

## 2010-10-31 ENCOUNTER — Encounter: Payer: Self-pay | Admitting: Pulmonary Disease

## 2010-11-01 ENCOUNTER — Ambulatory Visit: Payer: PRIVATE HEALTH INSURANCE | Admitting: Physical Therapy

## 2010-11-02 ENCOUNTER — Ambulatory Visit: Payer: PRIVATE HEALTH INSURANCE | Admitting: Pulmonary Disease

## 2010-11-08 ENCOUNTER — Ambulatory Visit
Admission: RE | Admit: 2010-11-08 | Discharge: 2010-11-08 | Disposition: A | Discharge status: Home / Self Care | Payer: PRIVATE HEALTH INSURANCE | Source: Ambulatory Visit | Attending: Orthopedic Surgery | Admitting: Orthopedic Surgery

## 2010-11-08 DIAGNOSIS — M25511 Pain in right shoulder: Secondary | ICD-10-CM

## 2010-11-09 ENCOUNTER — Ambulatory Visit: Payer: PRIVATE HEALTH INSURANCE | Attending: Orthopedic Surgery | Admitting: Physical Therapy

## 2010-11-09 DIAGNOSIS — IMO0001 Reserved for inherently not codable concepts without codable children: Secondary | ICD-10-CM | POA: Insufficient documentation

## 2010-11-09 DIAGNOSIS — M542 Cervicalgia: Secondary | ICD-10-CM | POA: Insufficient documentation

## 2010-11-09 DIAGNOSIS — M25519 Pain in unspecified shoulder: Secondary | ICD-10-CM | POA: Insufficient documentation

## 2010-11-09 DIAGNOSIS — R5381 Other malaise: Secondary | ICD-10-CM | POA: Insufficient documentation

## 2010-11-09 DIAGNOSIS — M2560 Stiffness of unspecified joint, not elsewhere classified: Secondary | ICD-10-CM | POA: Insufficient documentation

## 2010-11-21 NOTE — Assessment & Plan Note (Signed)
[facility]                          [facility] NOTE   NAME:Lagace, TALANI BRAZEE                   MRN:          [id number]  DATE:01/13/2008                            DOB:          October 18, 1950    PRIMARY CARE PHYSICIAN:  Delaney Meigs, MD   REASON FOR VISIT:  Routine followup.   HISTORY OF PRESENT ILLNESS:  Ms. Kretzschmar was seen back in April 2009.  Her history is detailed in my previous note as well as a followup  dictation from May 2009.  Her cardiac evaluation at this point has been  fairly reassuring with the exception of an abnormal echocardiographic  finding described as a mass-like area on the right atrial septum which  could potentially represent a myxoma versus simply somewhat asymmetric  lipomatous hypertrophy.  This finding is noted on a baseline of chronic  obstructive pulmonary disease with ongoing tobacco use, alcohol use,  chronic dyspnea, resting tachycardia, and chronic cough.  Baseline blood  work as detailed in my prior dictation was overall reassuring.  Our  general plan has been for a followup cardiac MRI to better understand  this atrial abnormality.  The patient has rescheduled both followup  visits as well as this study, although my understanding is that things  are finally planned for her to proceed on January 21, 2008.  She is not  reporting any new symptoms.   ALLERGIES:  No known drug allergies.   PRESENT MEDICATIONS:  1. She uses oxygen in the evenings at 2 L.  2. Advair 250/50 daily.  3. Spiriva daily.  4. Avalide 125 mg daily.  5. Singulair 10 mg daily.  6. Synthroid 0.75 mg daily.  7. Protonix 40 mg daily.  8. Lasix 20 mg up to 2 tablets daily (presently out).  9. ProAir p.r.n. as well as well as nebulizer treatments.   REVIEW OF SYSTEMS:  As described in history of present illness.  No  hemoptysis or fevers.  He states he is generally fatigued.  No  palpitations or syncope.   PHYSICAL  EXAMINATION:  VITAL SIGNS:  Blood pressure is 151/94, heart  rate 100-110, weight is 215 pounds.  GENERAL:  This is an overweight woman in no acute distress.  HEENT:  Conjunctivae are normal.  Oropharynx is clear.  NECK:  Supple.  No loud carotid bruits.  No elevated venous pressure.  LUNGS:  Diminished breath sounds throughout.  No active wheezing or  labored breathing at rest.  CARDIAC:  Regular rate and rhythm.  No S3, gallop, or pericardial rub.  ABDOMEN:  Obese and nontender.  EXTREMITIES:  Venous stasis and chronic-appearing edema, 1+.   IMPRESSION AND RECOMMENDATIONS:  1. Dyspnea on exertion and lower extremity edema, on baseline      emphysematous lung disease, ongoing tobacco abuse, hypertension,      and regular alcohol intake.  I suspect that her symptoms are      multifactorial in general.  Basic laboratory data were fairly      unrevealing and her left ventricular ejection fraction is normal at      55-60%.  Valvular  abnormalities were not an issue either.  She does      have a restrictive diastolic filling pattern and therefore, likely      an element of diastolic dysfunction is a component of her      symptomatology.  Obviously cessation of tobacco and alcohol use      would be beneficial as well as other risk factor modification      including better blood pressure control.  I have asked her to      refill her Lasix.  We will plan to see her back over the next      month.  2. Right atrial septal echogenic finding by echocardiography      suggestive of either mass or perhaps asymmetric lipomatous      hypertrophy.  She is scheduled for a cardiac MRI to better      understand this, and we can proceed from there as far as need for      further evaluation.  I discussed this with Dr. Eden Emms.  Will give      Valium pre-procedure.  I hope she will be able to be still and      maintain reasonable breath holds for the study.  If not, we could      consider a TEE.     Jonelle Sidle, MD  Electronically Signed    SGM/MedQ  DD: 01/13/2008  DT: 01/14/2008  Job #: [id number]   cc:   Delaney Meigs, M.D.

## 2010-11-21 NOTE — Assessment & Plan Note (Signed)
[facility]                          [facility] NOTE   NAME:[name] [name], [name] [name]                   MRN:          [id number]  DATE:10/13/2008                            DOB:          1951/03/09    PRIMARY CARDIOLOGIST:  Jonelle Sidle, MD   REASON FOR VISIT:  Scheduled followup.   [name] [name] returns to our clinic for ongoing monitoring of cor  pulmonale, with normal left ventricular function, and no documented  history of coronary artery disease.  Since last seen here in the clinic  in November of last year, she appears to be stable from a cardiovascular  standpoint, with no interim development of signs/symptoms suggestive of  congestive heart failure.  Most recently, she was treated with  antibiotics for possible pneumonia, which appears to have improved  significantly.   She does use supplemental oxygen as needed, and quit smoking tobacco in  September of last year.  She is due to follow up with her pulmonologist,  Dr. Vassie Loll, later this month.   [name] [name] can climb a full flight of stairs, but with a significant  element of shortness of breath upon completion.  She denies any  exertional angina pectoris.  She also appears to have significant reflux  symptoms, and also suggests dysphasia when drinking liquids.  She denies  any prior GI evaluation, including upper endoscopy.   CURRENT MEDICATIONS:  1. Lasix 40 daily.  2. Zoloft 25 daily.  3. Synthroid 0.1 mg daily.  4. Protonix 40 daily.  5. Avalide 12.5 mg daily.  6. Spiriva.  7. Advair.  8. Flonase.  9. ProAir.   PHYSICAL EXAMINATION:  VITALS SIGNS:  Blood pressure 140/88, pulse 103,  regular, and weight 207 (up 7 pounds).  General: A 60 year old female, moderately obese, sitting upright, no  distress.  HEENT:  Normocephalic and atraumatic.  NECK:  Palpable carotid pulses without bruits; no JVD at 90 degrees.  LUNGS:  Diminished, particularly on the right, with no  crackles or  wheezes.  HEART:  Regular rate and rhythm.  No significant murmurs.  No rubs.  ABDOMEN:  Protuberant, nontender.  EXTREMITIES:  1+ bilateral, nonpitting edema.  NEURO:  No focal deficit.   IMPRESSION:  1. Severe cor pulmonale.      a.     Severe right ventricular dysfunction by echocardiography,       September 2009.      b.     Normal left ventricular function (EF 55-60%).  2. Severe COPD.      a.     History of tobacco.  3. Small atrial fibroelastoma.  4. History of hypertension.  5. Hypothyroidism.  6. Morbid obesity.  7. Obstructive sleep apnea.   PLAN:  1. Surveillance 2-D echocardiogram in 6 months, for reassessment of      severity of right ventricular function and ongoing monitoring of a      small atrial fibroelaststoma.  2. Schedule return clinic followup with myself and Dr. Diona Browner in 6      months, for review of echocardiogram results and further      recommendations.  In the interim, [name] [name] is advised to      continue regular follow up with her pulmonologist, Dr. Vassie Loll, in      [place].      Rozell Searing, PA-C  Electronically Signed      Jonelle Sidle, MD  Electronically Signed   GS/MedQ  DD: 10/13/2008  DT: 10/14/2008  Job #: [id number]   cc:   Lindaann Pascal, PA-C  Oretha Milch, MD

## 2010-11-21 NOTE — Assessment & Plan Note (Signed)
[facility]                          [facility] NOTE   NAME:Arauz, VERALYN LOPP                   MRN:          [id number]  DATE:03/29/2008                            DOB:          1951-02-23    PRIMARY CARDIOLOGIST:  Jonelle Sidle, MD.   PRIMARY PHYSICIAN:  Delaney Meigs, M.D.   REASON FOR CONSULTATION.:  Ms. Campas is a 60 year old female who  established with [place] here in the clinic earlier this year, and who has no  known history of coronary artery disease.  She has been most recently  followed for an abnormal echocardiogram this past May, which suggested a  right atrial septal mass suspicious for myxoma versus lipomatous  hypertrophy.  Left ventricular function was preserved (EF 55-60%), and  there were no significant valvular abnormalities.  Of note, there was no  suggestion of right ventricular dysfunction or pulmonary hypertension,  as well.  Her main symptom was dyspnea in the setting of COPD and  tobacco abuse.   A followup cardiac MRI, reviewed by Dr. Charlton Haws, yielded a 6 x 9-mm  sessile mass in the atrial septum.  This was felt to be most consistent  with either a benign fibroelastoma or papilloma.  There was mild left  atrial enlargement and no evidence of ASD or VSD.  Calculated ejection  fraction was 54%.   These findings were reviewed with Dr. Simona Huh, who felt that this  finding was not clearly correlated with the patient's symptoms.  She  does have an element of diastolic dysfunction which was felt to be  potentially contributory, however.   The patient does have significant COPD with oxygen use in the evenings,  continues to smoke, and presented today for scheduled followup.  She  presents with worsening shortness of breath, both at rest and with  exertion, as well as intermittent chest pain.  She has gained  approximately 20 pounds since her last office visit, and reports no  significant  improvement, despite up titration of her Lasix by Dr.  Lysbeth Galas.  She has been started on spironolactone at 25 mg b.i.d., as  well.   The patient reports low grade fever, but with no significant productive  cough.  She denies any dysuria.  She does use oxygen at night, but not  throughout the day.  Her pulse oximetry saturation levels on room air  are in the high 70 range, at present.   ALLERGIES:  No known drug allergies.   CURRENT MEDICATIONS:  1. Furosemide 40 mg daily.  2. Spironolactone 25 mg b.i.d.  3. Ciprofloxacin 500 mg b.i.d.  4. Synthroid 0.1 mg daily.  5. Protonix 40 mg daily.  6. Singular 10 mg daily.  7. Avalide 12.5 mg daily.  8. Advair 250/50 mg daily.  9. Home oxygen (2 liters) nightly.   PAST MEDICAL HISTORY:  1. Hypertension.  2. Hypothyroidism.  3. Obstructive sleep apnea.  4. Morbid obesity.  5. COPD.  6. Status post left knee surgery.  7. Status post total abdominal hysterectomy.  8. Normal left ventricular function.  9. Small atrial septal mass -  benign fibroelastoma versus papilloma      per cardiac MRI.   SOCIAL HISTORY:  The patient lives alone in [place].  She has one grown  child.  She smokes anywhere from one to two packs a day, and started at  the age of 53.  Drinks alcohol on occasion.   FAMILY HISTORY:  Father deceased, reportedly secondary to a massive  heart attack or stroke.  A sister reportedly has a hole in her heart.   REVIEW OF SYSTEMS:  As outlined per HPI, remaining systems negative.   PHYSICAL EXAMINATION:  Blood pressure currently 154/95, pulse 118,  regular.  Saturations 78% room air.  Weight 236 (up 21).  GENERAL:  [age]-year-old female, sitting upright, in no distress.  HEENT:  Normocephalic, atraumatic.  PERRLA.  EOMI.  NECK:  Palpable carotid pulses without bruits; unable to assess JVD  secondary to neck girth.  LUNGS:  Diminished breath sounds throughout, with faint expiratory  wheezes.  No significant crackles in  the bases.  HEART:  Regular rhythm with increased rate.  No significant murmurs.  ABDOMEN:  Markedly protuberant, with intact bowel sounds.  Pitting edema  in the lower abdomen.  SKIN:  Warm and dry.  EXTREMITIES:  2-3+ bilateral pitting edema with mild erythema.  Nonpalpable dorsalis pedis pulses.  SKIN:  No rash or lesions.  MUSCULOSKELETAL:  No gross joint deformity.  NEURO:  No focal deficit.   IMPRESSION:  Ms. Flanagin is a 60 year old female with no known history  of coronary artery disease, normal left ventricular systolic function by  prior echocardiography and known diastolic dysfunction, and an apparent  benign small atrial septal mass by recent cardiac MRI imaging.  No ASD  or VSD was described and right ventricular size described as normal.   The patient presented to our clinic today for scheduled followup, but  presents with progressive and worsening shortness of breath both at rest  and with exertion.  She has gained approximately 20 pounds since her  last office visit, despite recent up titration of her Lasix and addition  of Aldactone.  Concern at this point is for possible acute/chronic  diastolic heart failure but, with a degree of generalized edema/anasarca  in the setting of COPD and hypoxia, right-sided symptoms are clearly to  be considered.  The patient also refers to recent low grade fever, but  no significant productive cough.  Of note, she was recently placed on  ciprofloxacin by her primary care physician.   PLAN:  1. The recommendation is that patient be admitted directly to the      intensive care unit, to be admitted under Dr. Truett Mainland service.  2. Aggressive diuretic treatment to be initiated with Lasix 80 mg IV      t.i.d.  The patient will need close monitoring of electrolytes and      renal function.  3. Blood work with complete metabolic profile, CBC/differential,      PT/PTT, TSH, and BNP level.  Portable chest x-ray.   Serial BMETs      q.a.m. with followup BNP in a.m.  4. 2-D echocardiogram for reassessment of left ventricular function,      as well as assessment for possible right heart failure and      pulmonary pressures.  5. Blood and urine cultures.  6. Follow-up of blood gas on room air.  7. 12-lead electrocardiogram and serial cardiac markers.  8. Regarding her medications, at this point we will  defer continuing      Aldactone, given treatment with aggressive dosing of IV Lasix.  We      will reassess resumption of this, following further evaluation.  9. Patient subsequently to be referred for a CT angiogram of the chest      and abdomen to exclude underlying pathology, such as thromboembolic      disease and mass/extrinsic obstruction of venous return that could      be related to her current presentation.  10.Further plans to follow.      Rozell Searing, PA-C  Electronically Signed      Jonelle Sidle, MD  Electronically Signed   GS/MedQ  DD: 03/29/2008  DT: 03/29/2008  Job #: [id number]

## 2010-11-21 NOTE — Assessment & Plan Note (Signed)
[facility]                          [facility] NOTE   NAME:[name] [name], [name] [name]                   MRN:          [id number]  DATE:10/22/2007                            DOB:          11/17/1950    REFERRING PHYSICIAN:  Delaney Meigs, M.D.   REASON FOR CONSULTATION:  Edema.   HISTORY OF PRESENT ILLNESS:  [name] is a pleasant 60 year old  woman with a history of hypertension, regular alcohol use, and a  longstanding continues tobacco use with a history of emphysema  discharged back in 1989.  She reports that she has had flares of upper  respiratory tract infections, presumably recurrent bronchitis requiring  antibiotics and over the last several weeks has had trouble with  swelling in her legs.  She has also had a cough which is chronic, and  states that she sleeps with her head elevated on a few pillows.  This  has also been chronic and has not changed to any significant degree.  She has no history of exertional chest pain but has had recent pleuritic  discomfort, also a cramping in her thoracic area when she twists a  certain way.  She did have a chest x-ray done recently on the 9th of  April demonstrating emphysematous changes with minimal atelectasis  versus scarring at the lung bases.  The pericardial silhouette was at  the upper limits of normal.  She has been treated with empiric Lasix  increased from 20 to 40 mg daily and had had some improvement in her  lower extremity edema.  She is referred today to discuss this further.  She denies having any prior cardiac testing and is worried about doing  any stress test.  Electrocardiogram today shows sinus tachycardia,  otherwise normal.   ALLERGIES:  No known drug allergies.   PRESENT MEDICATIONS:  1. Advair 250/50 daily.  2. Spiriva.  3. Avalide 125 mg daily.  4. Singulair 10 mg daily.  5. Synthroid 0.75 mg daily.  6. Protonix 40 mg daily.  7. Lasix 40 mg daily.  8. She  does use oxygen 2 liters nasal cannula in the evenings.   PAST MEDICAL HISTORY:  As discussed above.  She has a longstanding  history of hypertension, no diabetes mellitus, no known cardiovascular  disease.  She is status post hysterectomy and left knee surgery.   REVIEW OF SYSTEMS:  As outlined above.  She has chronic dyspnea on  exertion an NYHA class III.  She continues to smoke cigarettes and has  not been able to quit.  She has a history of hemorrhoids.  She has joint  stiffness, predominately her knees and has had some swelling of her  right knee.  Has a history of depression.  Also history of sleep apnea.   FAMILY HISTORY:  Significant for hypertension.  Patient states that her  father died of a massive heart attack or stroke.  He was also a heavy  drinker.  She has a sister with a hole in her heart.   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  She states that she  can drink up to 12 beers at a time.  Uses no other drugs.  She has a  longstanding tobacco use history that is ongoing.   PHYSICAL EXAMINATION:  VITAL SIGNS:  Patient is 207 pounds.  Heart rate  into the 90s at rest.  Blood pressure 159/110 at baseline after walking  in, down to 149/94 at rest.  GENERAL:  This is an overweight woman in no acute distress.  HEENT:  Conjunctivae is normal.  Pharynx is clear.  NECK:  Supple.  Noelevated jugular venous pressure.  No loud bruits.  No  thyromegaly is noted.  LUNGS:  Exhibit diminished breath sounds throughout, somewhat rhoncorous  coarse sounds.  No frank wheezing.  No egophony.  CARDIAC:  Reveals a regular rate and rhythm.  Possible S3.  Soft  systolic murmur.  No pericardial rub.  ABDOMEN:  Soft, nontender and normoactive bowel sounds.  EXTREMITIES:  Exhibit some venous stasis predominately around the ankles  and lower legs, 1+ pitting edema remaining.  SKIN:  Otherwise warm and dry.   MUSCULOSKELETAL:  No kyphosis noted.  NEUROPSYCHIATRIC:  The patient is alert and oriented x3.  Affect is  appropriate.   IMPRESSION AND RECOMMENDATIONS:  Lower extremity edema associated with a  longstanding history of emphysema and ongoing tobacco use as well as  longstanding hypertension (not optimally controlled) and longstanding  regular alcohol intake.  The patient's electrocardiogram shows sinus  tachycardia but is otherwise nonspecific.  She has had some symptomatic  improvement on Lasix.  Today, we discussed all of these issues and my  recommendation is to begin with a basic 2-D echocardiogram to assess  both left and right ventricular function and also labs to include a CBC,  BMET, BNP and a TSH level.  I will have her follow up in the office to  discuss these and proceed from there.     Jonelle Sidle, MD  Electronically Signed    SGM/MedQ  DD: 10/22/2007  DT: 10/22/2007  Job #: [id number]   cc:   Delaney Meigs, M.D.

## 2010-11-21 NOTE — Assessment & Plan Note (Signed)
[facility]                          [facility] NOTE   NAME:[name] [name], [name] [name]                   MRN:          [id number]  DATE:05/11/2008                            DOB:          October 21, 1950    PRIMARY CARE Juleon Narang:  Dr. Lindaann Pascal at [facility].   PULMONOLOGIST:  Oretha Milch, MD   REASON FOR VISIT:  Post-hospitalization followup.   HISTORY OF PRESENT ILLNESS:  [name] [name] was most recently seen in our  office back in late September.  She presented at that time with  progressive shortness of breath and increased weight with anasarca in  the setting of chronic obstructive pulmonary disease and hypoxia.  She  had prior assessment of left ventricular and right ventricular systolic  function with reassuring findings, although it was suspected that she  had developed secondary right-sided heart failure and was admitted to  the hospital for further aggressive diuresis and evaluation.  She was  initially at [facility] and was noted to have  hypercarbic respiratory failure ultimately requiring mechanical  ventilation, failing BiPAP.  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  There were concerns about possible pulmonary veno-  occlusive disease or pulmonary arterial hypertension, and she was  ultimately transferred to [facility] under the care of the  Pulmonary Critical Care Team.  Pulmonary artery pressure based on  echocardiographic assessment was approximately 50 mmHg, and she did have  a CT scan ultimately obtained, which did not demonstrate any obvious  pulmonary embolus.  She was admitted to [facility] between  April 02, 2008 and April 09, 2008, and was  managed by the Pulmonary  Service with continued diuresis and extubation.  She was treated with  antibiotics as well as steroids with a final diagnosis of chronic  obstructive pulmonary disease with acute exacerbation resulting in  hypercarbic respiratory failure and decompensated cor pulmonale.  BUN  and creatinine as of discharge were 10 and 0.5 respectively with a  bicarb of 34 and a potassium of 3.3.  I reviewed her discharge  medications.  She has completed her steroid taper and is now back on  Lasix at every other day dosing.  She saw Dr. Vassie Loll in late October, and  there are plans for regular Pulmonary Medicine followup and additional  testing.   I reviewed [name]'s history and went over the details with her.  She has been consistently noted to have normal left ventricular systolic  function and no other obvious intracardiac shunting.  The small atrial  fibroelastoma is likely of little clinical significance as it relates to  her present condition, and her surgical risk would clearly not be  insignificant given her pulmonary disease.  I reviewed with her today  the interaction between her chronic progressive lung disease and  exacerbation of right-sided heart failure symptoms.  We talked about  advancing her diuretics.  She states that she  has stopped smoking since  her hospital stay.  She is supposed to be wearing oxygen, and it sounds  like she has plans for further pulmonary function tests and perhaps even  a sleep study.  She is considering the possibility of disability  determinations.  At the present time, she is working in a [employer] 3 days a week, approximately 35 hours during this 3 days.   ALLERGIES:  No known drug allergies.   PRESENT MEDICATIONS:  1. Advair 250/50 one puff b.i.d.  2. Spiriva daily.  3. Avalide 150/12.5 mg p.o. daily.  4. Protonix 40 mg p.o. daily.  5. Synthroid 100 mcg p.o. daily.  6. Lasix 40 mg p.o. q.o.d.  7. Oxygen 2 L via  nasal cannula during waking hours.  8. ProAir inhaler p.r.n.  9. Nasacort p.r.n.  10.Nebulizer treatments p.r.n.   REVIEW OF SYSTEMS:  As outlined above.  She has not had any cough or  hemoptysis.  She has fleeting atypical chest pains, likely related to  her underlying chronic lung disease.  She continues to have lower  extremity edema, although significantly improved compared to our last  visit.  She has lost approximately 36 pounds in fluid weight since  September based on serial weights.  Otherwise, negative.   PHYSICAL EXAMINATION:  VITAL SIGNS:  Blood pressure was 161/100, heart  rate 96, and weight 200 pounds.  GENERAL:  An obese woman, not wearing her oxygen at present, in no acute  distress.  HEENT:  Conjunctivae normal.  Oropharynx clear with poor dentition.  NECK:  Supple.  No elevated jugular venous pressure.  No loud bruits.  LUNGS:  Significantly diminished breath sounds throughout.  No wheezing,  however.  CARDIAC:  Distant regular heart sounds.  No loud systolic murmur or S3  gallop.  ABDOMEN:  Obese and nontender.  EXTREMITIES:  Exhibits chronic-appearing 1+ edema and venous stasis with  dry and irritated skin.  Distal pulses 1+.  SKIN:  Warm and dry.  MUSCULOSKELETAL:  No kyphosis noted.  NEUROPSYCHIATRIC:  The patient is alert and oriented x3.  Affect seems  appropriate.   IMPRESSION AND RECOMMENDATIONS:  1. From a cardiac perspective, [name] [name] has normal left      ventricular systolic function and no evidence of intracardiac shunt      based on assessment most recently with a transesophageal      echocardiogram.  It is most likely that her progressive shortness      of breath and presentation with anasarca were related to      decompensated chronic lung disease with cor pulmonale.  2. Chronic obstructive pulmonary disease, likely severe, now oxygen      dependent, status post presentation with acute exacerbation and cor      pulmonale.  [name] [name] is  now being followed by Dr. Vassie Loll.  I      recommended that she increase her Lasix to 40 mg daily.  She will      have a followup BMET over the next 2 weeks.  I suspect she will      require a long-term diuretic.  She states that she has stopped      smoking at this point.  No recent cough, fevers, or chills.  There      are additional plans for evaluation of sleep apnea as well.  3. 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  Will      follow at this point.  Surgical risk  would be increased based on      lung disease.     Jonelle Sidle, MD  Electronically Signed    SGM/MedQ  DD: 05/11/2008  DT: 05/11/2008  Job #: [id number]   cc:   Dr. Ella Bodo, MD

## 2010-11-21 NOTE — Discharge Summary (Signed)
[name], [name] NO.:  [id number]   MEDICAL RECORD NO.:  [id number]          PATIENT TYPE:  INP   LOCATION:  4715                         FACILITY:  [facility]   PHYSICIAN:  Oretha Milch, MD      DATE OF BIRTH:  03/30/1951   DATE OF ADMISSION:  04/02/2008  DATE OF DISCHARGE:  04/09/2008                               DISCHARGE SUMMARY   DISCHARGE DIAGNOSIS:  Chronic obstructive pulmonary disease with acute  exacerbation resulting in hypercarbic respiratory failure and  decompensated cor pulmonale.   LABORATORY DATA:  Date April 09, 2008; sodium 138, potassium 3.3,  chloride 97, CO2 34, BUN 10, creatinine 0.57, glucose 73.  Hemoglobin  15.3, hematocrit 48.5, white blood cell count 7.6, platelet count 255.   CULTURE DATA:  None.   PROCEDURES:  1. Endotracheal tube placed on March 30, 2008, removed on      April 05, 2008.  2. Triple-lumen catheter, right subclavian, placed on April 02, 2008, removed on April 07, 2008.  3. Radial A-line placed on April 02, 2008, removed September on      30, 2009.   BRIEF HISTORY:  A 60 year old female with severe chronic obstructive  pulmonary disease, oxygen dependent and noncompliant at home, still  active smoker.  Presented to [facility] with progressive anasarca and hypoxemia.  She was admitted on  March 29, 2008 for progressive hypercarbic respiratory failure.  TEE  and TTE were completed at [facility] showing severe right ventricular  overload which is a new problem following prior studies compared to  prior evaluations.  CT angiogram was negative for pulmonary emboli.  She  was transferred to [facility] for further evaluation and  therapy.   HOSPITAL COURSE BY DISCHARGE DIAGNOSIS:  1. Acute exacerbation of chronic obstructive pulmonary disease      resulting in hypercarbic respiratory failure and decompensated cor      pulmonale.  [name] [name]  was admitted to the intensive care on      full ventilatory support.  She was maintained on IV systemic      steroids, empiric 5-day course of ceftriaxone, additionally was      placed on inhaled bronchodilators.  She continued to improve over      the course of her hospitalization.  She was successfully extubated      as mentioned above.  Upon time of discharge, she is close to      baseline functional status and exertional dyspnea status.  She does      demonstrate decompensation with saturations notably oxygen      saturations dropping below 88% with activity.  She will therefore      be discharged to home with the following instructions; oxygen to be      worn at 2 liters via nasal cannula with activity and sleep, she      will resume her typical bronchodilator regimen including Spiriva      daily, and Advair twice daily.  Additionally, she will be on a slow  prednisone taper over the course of the next 10 days following      discharge.  Finally, again she was instructed on the importance of      smoking cessation.  She is now being discharged to home with      outpatient followup with Dr. Vassie Loll on April 28, 2008.  She will be      discharged to home in stable condition.   DISCHARGE INSTRUCTIONS:  1. Diet low-sodium heart-healthy diet.  2. Smoking cessation.  3. She is to wear oxygen at 2 liters nasal cannula with activity and      sleep.   DISCHARGE MEDICATIONS:  1. Synthroid 0.1 mg daily.  2. Protonix 40 mg daily.  3. Singulair 10 mg daily.  4. Avalide 150/12.5 once daily.  5. Advair 250/50 one inhaled twice a day.  6. Oxygen 2 liters as mentioned above.  7. Spiriva 18 mcg cap inhaled daily.  8. Prednisone 2 tablets p.o. daily x5 days, then 1 tablet p.o. daily      x5 days, then discontinue.   DISPOSITION:  [name] [name] has met maximum benefit from inpatient stay.  She will now be discharged to home with outpatient followup.      [name] Resides, NP       Oretha Milch, MD  Electronically Signed    PB/MEDQ  D:  04/09/2008  T:  04/09/2008  Job:  [id number]

## 2010-11-21 NOTE — Assessment & Plan Note (Signed)
[facility]                          [facility] NOTE   NAME:Dibuono, RIANE RUNG                   MRN:          [id number]  DATE:10/13/2008                            DOB:          04/03/1951    ADDENDUM   IMPRESSION:  1. History of hypertension.  2. Hypothyroidism.  3. Morbid obesity.  4. Obstructive sleep apnea.   PLAN:  1. Surveillance 2-D echocardiogram in 6 months, for reassessment of      severity of right ventricular function and ongoing monitoring of a      small atrial fibroelaststoma.  2. Schedule return clinic followup with myself and Dr. Diona Browner in 6      months, for review of echocardiogram results and further      recommendations.  In the interim, Ms. Jolliffe is advised to      continue regular follow up with her pulmonologist, Dr. Vassie Loll, in      [place].     Gene Serpe, PA-C     GS/MedQ  DD: 10/13/2008  DT: 10/14/2008  Job #: [id number]

## 2010-11-23 ENCOUNTER — Ambulatory Visit: Payer: PRIVATE HEALTH INSURANCE | Admitting: Physical Therapy

## 2010-11-24 NOTE — Assessment & Plan Note (Signed)
[facility]                             PULMONARY OFFICE NOTE   NAME:[name] [name], [name] [name]                   MRN:          [id number]  DATE:11/29/2006                            DOB:          [birth year]    CHIEF COMPLAINT:  Evaluate dyspnea.   HISTORY OF PRESENT ILLNESS:  This is 60 year old female who has had  shortness of breath for several years, diagnosed with COPD in 1989 with  associated asthmatic bronchitis, noting increased cough, still actively  smoking. Short of breath for the past month with increased hoarseness  and throat closure. Increased postnasal drainage and increased cough  with thick yellow mucus. Patient short of breath both resting and  exertion. She is having some chest discomfort, irregular heart beats,  increased acid symptoms, difficultly swallowing, increased nasal  drainage, increased sneezing, itching, anxiety, depression, joint  stiffness. She smokes a pack and a half, 2 packs a day for __________  years, years, continues to do so.   PAST MEDICAL HISTORY:   MEDICAL HISTORY:  Hypertension, history of chronic allergies, chronic  sinus drainage, history of sleep apnea in the past but apparently  diagnosed 2004 with a sleep study but never received a CPAP machine.   OPERATIVE HISTORY:  Hysterectomy in 1989.   MEDICATION ALLERGIES:  None.   CURRENT MEDICATIONS:  1. Avalide 300/12.5 daily.  2. Singulair 10 mg daily.  3. Protonix 40 mg daily.  4. Advair 250/50 one spray b.i.d.  5. Nasonex 2 sprays each nostril daily.  6. Oxygen 2 liters continuous.  7. Nebulized albuterol  b.i.d.  8. Zyrtec or Allegra D daily.   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   FAMILY HISTORY:  Emphysema, heart disease, rheumatism, otherwise review  of systems noncontributory.   PHYSICAL EXAMINATION:  This is a middle aged female in no distress.  Temperature 98, blood pressure 150/94,  pulse 102, saturation 91% on 2  room air.  CHEST: Showed inspiratory and expiratory wheeze with poor airflow.  CARDIAC EXAM: Showed a regular rate and rhythm without S3. Normal S1,  S2.  ABDOMEN: Soft, nontender, protuberant.  EXTREMITIES: Showed no edema, clubbing, or venous disease.  SKIN: Clear.  NEUROLOGIC EXAM: Intact.  HEENT EXAM: Showed no jugular venous distension. No lymphadenopathy,  oropharynx  clear.  NECK: Supple.   Chest x-ray was obtained, showed COPD changes. Pulmonary functions were  obtained showing FEV 1 of 0.62 which is 25% predicated, FVC of 1.61, 54%  of predicted.   IMPRESSION:  Is that of severe obstructive defect with asthmatic  bronchitic component, smoking abuse, sleep apnea.   PLAN:  To referred to Dr. Coralyn Helling for sleep evaluation and repeat  sleep study. Patient is to increase Advair to 500/50 one spray b.i.d.  Pulse prednisone was given. Chantix was given for smoking cessation. She  was given doxicycline 100 mg b.i.d. for 7 day course. We will see the  patient back in follow up in 1 month for a recheck.     Charlcie Cradle Delford Field, MD, [facility]  Electronically Signed  PEW/MedQ  DD: 11/29/2006  DT: 11/29/2006  Job #: [id number]   cc:   Delaney Meigs, M.D.

## 2010-11-24 NOTE — Op Note (Signed)
[name] [name], BIGGAR NO.:  [id number]   MEDICAL RECORD NO.:  [id number]          PATIENT TYPE:  OBV   LOCATION:  1621                         [facility]AC[facility]   PHYSICIAN:  Ollen Gross, M.D.    DATE OF BIRTH:  1950-10-04   DATE OF PROCEDURE:  02/07/2005  DATE OF DISCHARGE:                                 OPERATIVE REPORT   PREOPERATIVE DIAGNOSIS:  Left knee medial meniscal tear.   POSTOPERATIVE DIAGNOSIS:  Left knee medial meniscal tear.   PROCEDURE:  Left knee arthroscopy with meniscal debridement.   SURGEON:  Ollen Gross, M.D.   ANESTHESIA:  Spinal.   ESTIMATED BLOOD LOSS:  Minimal.   DRAINS:  None.   COMPLICATIONS:  None.   CONDITION:  Stable to the recovery room.   CLINICAL HISTORY:  The patient is a 60 year old female with significant left  knee pain and mechanical symptoms.  Exam and history are consistent medial  meniscal tear confirmed by MRI. She presents now for arthroscopy and  debridement.   PROCEDURE IN DETAIL:  After successful initiation of spinal anesthetic, a  tourniquet was placed on the left thigh and left lower extremity prepped and  draped in usual sterile fashion.  The superomedial inferolateral incision  was made and the inflow cannula passed superomedial and camera passed  inferolateral.  Arthroscopic visualization proceeds.  The undersurface of  the patella shows grade 1 chondromalacia and no defects.  The trochlea is  also grade 1 with no defects.  There was some slight synovitis in the  suprapatellar area.  Medial and lateral gutters were visualized.  There was  no loose body.  Flexion and valgus force was applied to the knee and the  medial compartment was entered.  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  There is a  degenerative tear at the body and posterior horn of the medial meniscus  which is unstable. Spinal needle is used to localize the inferomedial  portal, a small incision made, dilator placed and then the tear is probed  and found to be grossly unstable.  It was debrided back to stable a base  with baskets and a 4.2 mm shaver.  About 30% of the meniscus was removed.  The meniscal remnant is stable.  The rest of the medial compartment looks  fine. There were chondral notches visualized. There is some inflamed  synovium anterior to the ligaments and the ACL appears normal.  Lateral  compartment centered and looks perfectly normal.  Arthroscopic equipment was  removed  from the inferior portals which were closed with interrupted 4-0 nylon.  The  inflow cannula removed and that portal was closed with nylon also.  A bulky  sterile dressing was applied, and she is awakened and transported to  recovery in stable condition.       FA/MEDQ  D:  02/07/2005  T:  02/08/2005  Job:  [id number]

## 2010-11-24 NOTE — Assessment & Plan Note (Signed)
[facility]                          [facility] OFFICE NOTE   NAME:[name] [name], [name] [name]                   MRN:          [id number]  DATE:11/28/2007                            DOB:          07-21-1950    PRIMARY CARE PHYSICIAN:  Dr. Delaney Meigs.   I saw [name] [name] Back in April.  She was referred at that time with a  history of lower extremity edema, associated with emphysema, ongoing  tobacco use, longstanding hypertension, and alcohol abuse.  We referred  her for blood work, as well as an echocardiogram.  Her labs indicated a  normal TSH of 2.86, sodium of 136, potassium 4.5, BUN 8, creatinine 0.7,  glucose 126, hemoglobin of 17.2 and a white blood cell count of 4.9.  Her echocardiogram was interpreted by Dr. Andee Lineman and revealed a left  ventricular ejection fraction of 55-60%, with no significant valvular  lesions.  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  I reviewed the study and it is difficult to say.  Dr.  Andee Lineman indicated a possible cardiac MRI for follow-up.  I discussed this  with Dr. Eden Emms by phone, and we will try to arrange a cardiac MRI for a  better definition of this issue.  She does have a rapid heart rate at  rest, although this should not affect the study adversely as long as she  is able to maintain adequate breath holds.  She has unfortunately  cancelled her last two visits on Nov 21, 2007 and Nov 25, 2007.  I have  asked our nurse, Antony Contras to continue to recontact the patient, explain the  issues regarding her need for follow up cardiac testing, and rearrange  of follow-up in the office.     Jonelle Sidle, MD  Electronically Signed    SGM/MedQ  DD: 11/28/2007  DT: 11/28/2007  Job #: [id number]   cc:   Delaney Meigs, M.D.

## 2010-11-27 ENCOUNTER — Encounter: Payer: Self-pay | Admitting: Emergency Medicine

## 2010-11-29 ENCOUNTER — Ambulatory Visit: Payer: PRIVATE HEALTH INSURANCE | Admitting: Physical Therapy

## 2010-12-01 ENCOUNTER — Ambulatory Visit (INDEPENDENT_AMBULATORY_CARE_PROVIDER_SITE_OTHER): Payer: PRIVATE HEALTH INSURANCE | Admitting: Pulmonary Disease

## 2010-12-01 ENCOUNTER — Encounter: Payer: Self-pay | Admitting: Pulmonary Disease

## 2010-12-01 VITALS — BP 150/90 | HR 96 | Temp 98.4°F | Ht 63.5 in | Wt 215.0 lb

## 2010-12-01 DIAGNOSIS — I279 Pulmonary heart disease, unspecified: Secondary | ICD-10-CM

## 2010-12-01 DIAGNOSIS — G4730 Sleep apnea, unspecified: Secondary | ICD-10-CM

## 2010-12-01 DIAGNOSIS — J449 Chronic obstructive pulmonary disease, unspecified: Secondary | ICD-10-CM

## 2010-12-01 MED ORDER — FLUTICASONE PROPIONATE 50 MCG/ACT NA SUSP: 2.0000 | Freq: Every day | NASAL | Status: DC | PRN

## 2010-12-01 MED ORDER — TIOTROPIUM BROMIDE MONOHYDRATE 18 MCG IN CAPS: 18.0000 ug | ORAL_CAPSULE | Freq: Every day | RESPIRATORY_TRACT | Status: DC

## 2010-12-01 MED ORDER — FLUTICASONE-SALMETEROL 250-50 MCG/DOSE IN AEPB: 1.0000 | INHALATION_SPRAY | Freq: Two times a day (BID) | RESPIRATORY_TRACT | Status: DC

## 2010-12-01 MED ORDER — LEVALBUTEROL TARTRATE 45 MCG/ACT IN AERO: 2.0000 | INHALATION_SPRAY | RESPIRATORY_TRACT | Status: DC | PRN

## 2010-12-01 MED ORDER — FUROSEMIDE 40 MG PO TABS: 40.0000 mg | ORAL_TABLET | Freq: Every day | ORAL | Status: DC

## 2010-12-01 MED ORDER — ALBUTEROL SULFATE (2.5 MG/3ML) 0.083% IN NEBU: 2.5000 mg | INHALATION_SOLUTION | Freq: Four times a day (QID) | RESPIRATORY_TRACT | Status: DC | PRN

## 2010-12-01 NOTE — Progress Notes (Signed)
  Subjective:    Patient ID: [name] [name], female    DOB: July 30, 1950, 60 y.o.   MRN: [id number]  HPI PCP - Dr Caryn Bee 4 /F, ex- smoker, with gold stg 2 COPD on O2  & likely, obstructive sleep apnea .   4/10 >>has gained wt 197 --> 216 lbs, quit smoking since 9/09  Dyspnea worse 3/10, CXR showed bi basal scarring. 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 pt states breathing has been the same, but stopped the advair 1week ago, stating that it causes congestion, and that she does not always use her oxygen at bedtime. Sats were 75%. on RA.   Hospitalised 9/09 >> cor pulmonale & hypercarbic RF requiring mechanical ventilation. Did not tolerate CPAP after extubation.  Echo >> RV overload (new compared to old echoes)  Ct angio > no PE  cardiac MRI >> 6 x 9-mm sessile mass in the atrial septum. 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 There was mild left atrial enlargement and no evidence of ASD or VSD. Calculated ejection fraction was 54%. This has not been felt to be contributory to pulmonary hypertension by Cards.  4/10 PFTs >> moderate obstruction, FEV1 48%, small airways 12%   12/01/2010 Hosp visit feb '12. For RML CAP. Works in [employer], compliant with O2 C/o pedal edema       Review of Systems Pt denies any significant  nasal congestion or excess secretions, fever, chills, sweats, unintended wt loss, pleuritic or exertional cp, orthopnea pnd or leg swelling.  Pt also denies any obvious fluctuation in symptoms with weather or environmental change or other alleviating or aggravating factors.    Pt denies any increase in rescue therapy over baseline, denies waking up needing it or having early am exacerbations or coughing/wheezing/ or dyspnea       Objective:   Physical Exam    Gen. Pleasant, well-nourished, in no distress ENT - no lesions, no post nasal drip Neck: No JVD, no  thyromegaly, no carotid bruits Lungs: no use of accessory muscles, no dullness to percussion, decreased without rales or rhonchi  Cardiovascular: Rhythm regular, heart sounds  normal, no murmurs or gallops, 1+ peripheral edema Musculoskeletal: No deformities, no cyanosis or clubbing      Assessment & Plan:

## 2010-12-01 NOTE — Patient Instructions (Addendum)
You have COPD - a combination of chronic bronchitis & emphysema Stay on advair, spiriva  Oxygen Use nebs for rescue Take lasix when you see swelling in your legs.

## 2010-12-07 ENCOUNTER — Ambulatory Visit: Payer: PRIVATE HEALTH INSURANCE | Admitting: Physical Therapy

## 2010-12-11 NOTE — Assessment & Plan Note (Signed)
Ct advair , spiriva Pulm rehab has been discussed with her

## 2010-12-11 NOTE — Assessment & Plan Note (Signed)
Would like to pursue sleep study but she is not willing.

## 2010-12-11 NOTE — Assessment & Plan Note (Addendum)
pulm htn & hypoxia seem to be out of proportion to degree of airway obstruction Lasix ok emphasised compliance with O2

## 2010-12-12 ENCOUNTER — Encounter: Payer: Self-pay | Admitting: Physician Assistant

## 2010-12-13 ENCOUNTER — Ambulatory Visit: Payer: PRIVATE HEALTH INSURANCE | Admitting: Physical Therapy

## 2010-12-21 ENCOUNTER — Ambulatory Visit: Payer: PRIVATE HEALTH INSURANCE | Attending: Orthopedic Surgery | Admitting: Physical Therapy

## 2010-12-21 DIAGNOSIS — M542 Cervicalgia: Secondary | ICD-10-CM | POA: Insufficient documentation

## 2010-12-21 DIAGNOSIS — M25519 Pain in unspecified shoulder: Secondary | ICD-10-CM | POA: Insufficient documentation

## 2010-12-21 DIAGNOSIS — R5381 Other malaise: Secondary | ICD-10-CM | POA: Insufficient documentation

## 2010-12-21 DIAGNOSIS — M2560 Stiffness of unspecified joint, not elsewhere classified: Secondary | ICD-10-CM | POA: Insufficient documentation

## 2010-12-21 DIAGNOSIS — IMO0001 Reserved for inherently not codable concepts without codable children: Secondary | ICD-10-CM | POA: Insufficient documentation

## 2010-12-27 ENCOUNTER — Ambulatory Visit: Payer: PRIVATE HEALTH INSURANCE | Admitting: Physical Therapy

## 2011-01-04 ENCOUNTER — Ambulatory Visit: Payer: PRIVATE HEALTH INSURANCE | Admitting: Physical Therapy

## 2011-01-11 ENCOUNTER — Ambulatory Visit: Payer: PRIVATE HEALTH INSURANCE | Attending: Orthopedic Surgery | Admitting: Physical Therapy

## 2011-01-11 DIAGNOSIS — R5381 Other malaise: Secondary | ICD-10-CM | POA: Insufficient documentation

## 2011-01-11 DIAGNOSIS — M2560 Stiffness of unspecified joint, not elsewhere classified: Secondary | ICD-10-CM | POA: Insufficient documentation

## 2011-01-11 DIAGNOSIS — M542 Cervicalgia: Secondary | ICD-10-CM | POA: Insufficient documentation

## 2011-01-11 DIAGNOSIS — M25519 Pain in unspecified shoulder: Secondary | ICD-10-CM | POA: Insufficient documentation

## 2011-01-11 DIAGNOSIS — IMO0001 Reserved for inherently not codable concepts without codable children: Secondary | ICD-10-CM | POA: Insufficient documentation

## 2011-01-15 ENCOUNTER — Encounter: Payer: PRIVATE HEALTH INSURANCE | Admitting: Surgery

## 2011-01-17 ENCOUNTER — Encounter (INDEPENDENT_AMBULATORY_CARE_PROVIDER_SITE_OTHER): Payer: PRIVATE HEALTH INSURANCE

## 2011-01-17 ENCOUNTER — Encounter (INDEPENDENT_AMBULATORY_CARE_PROVIDER_SITE_OTHER): Payer: PRIVATE HEALTH INSURANCE | Admitting: Vascular Surgery

## 2011-01-17 DIAGNOSIS — I739 Peripheral vascular disease, unspecified: Secondary | ICD-10-CM

## 2011-01-17 DIAGNOSIS — I70219 Atherosclerosis of native arteries of extremities with intermittent claudication, unspecified extremity: Secondary | ICD-10-CM

## 2011-01-17 NOTE — Consult Note (Signed)
VASCULAR SURGERY CONSULTATION  LETTI, TOWELL DOB:  Aug 14, 1950                                       01/17/2011 CHART#:[id number]  I saw patient in the office today in consultation concerning her leg pain.  She was referred from Dr. Varney Baas office.  This is a pleasant 60 year old woman who states that she has had a long history of pain in the left calf which she has had for several months.  She explains some tightness in the calf which is sometimes aggravated by ambulation.  She does state that it occasionally occurs simply with standing and sitting.  She experiences some paresthesias in the left leg also.  In addition, she has a history of back pain.  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  I do not get any history of rest pain.  She does describe the pain at times as a burning sensation.  I do not get any clear-cut history of claudication.  PAST MEDICAL HISTORY:  Significant for insulin dependent diabetes, hypertension, hypercholesterolemia, and COPD.  In addition, she has had a previous left knee arthroscopy.  SOCIAL HISTORY:  She is married.  She has 1 child.  She quit tobacco 3 years ago.  FAMILY HISTORY:  She is unaware of any history of premature cardiovascular disease.  REVIEW OF SYSTEMS:  GENERAL:  She has had no recent weight loss.  She has gained some weight. CARDIOVASCULAR:  She has occasional chest pressure which has been stable.  This has not changed in character.  She admits to dyspnea on exertion.  She has had no history of stroke or TIAs.  She has had no history of DVT. GI:  She has a history of reflux and occasional problems swallowing. NEUROLOGIC:  She has had occasional dizziness and headaches. PULMONARY:  She had has productive cough.  Had been on oxygen in the past.  She has occasional bronchitis. GU:  She has urinary frequency. ENT:  She has had some  sore throat. MUSCULOSKELETAL:  She admits to joint pain, arthritis, and muscle pain. PSYCHIATRIC:  She has had depression, anxiety. Hematologic review of systems and integumentary review of systems is unremarkable and is documented on the medical history form in her chart.  PHYSICAL EXAMINATION:  This is a pleasant 60 year old woman who appears her stated age.  Blood pressure 156/82, heart rate is 89, respiratory rate is 24.  HEENT:  Unremarkable.  Lungs are clear bilaterally to auscultation without rales, rhonchi or wheezing.  On cardiovascular exam, I do not detect any carotid bruits.  She has a regular rate and rhythm.  She has palpable femoral, popliteal, dorsalis pedis and posterior tibial pulses bilaterally.  Abdomen:  Soft and nontender with normal pitched bowel sounds.  She is obese, and it is difficult to assess for any masses.  Musculoskeletal:  There are no major deformities or cyanosis.  Neurologic:  She has no focal weakness or paresthesias. Skin:  She does have some hyperpigmentation bilaterally.  I have reviewed her records sent by [facility].  She does have a history of otitis externa and labyrinthitis.  I have also reviewed her labs, which show a creatinine of 0.57.  Her HDL cholesterol was 48 and LDL cholesterol is 147.  I have independently interpreted her arterial Doppler  study in our office today which shows triphasic Doppler signals in the posterior tibial and anterior tibial signals bilaterally.  She has ABIs of 100% bilaterally.  I have reassured her that she has no evidence by exam or Doppler study of arterial occlusive disease.  I think her symptoms are related to her neuropathy, and also she may have some underlying chronic venous insufficiency.  I have discussed the importance of intermittent leg elevation.  She is planning on getting off work on disability and so will be able to stay off her feet more.  If she is on her feet a lot, consideration should  be given to giving her compression stockings with a gradient of 20-30 mmHg.  I will be happy to see her back at any time if any new vascular issues arise.    Di Kindle. Edilia Bo, M.D. Electronically Signed CSD/MEDQ  D:  01/17/2011  T:  01/17/2011  Job:  [id number]  cc:   [facility]

## 2011-01-18 ENCOUNTER — Ambulatory Visit: Payer: PRIVATE HEALTH INSURANCE | Admitting: Physical Therapy

## 2011-01-25 ENCOUNTER — Ambulatory Visit: Payer: PRIVATE HEALTH INSURANCE | Admitting: Physical Therapy

## 2011-02-01 ENCOUNTER — Ambulatory Visit: Payer: PRIVATE HEALTH INSURANCE | Admitting: Physical Therapy

## 2011-02-08 ENCOUNTER — Ambulatory Visit: Payer: PRIVATE HEALTH INSURANCE | Attending: Orthopedic Surgery | Admitting: Physical Therapy

## 2011-02-08 DIAGNOSIS — M542 Cervicalgia: Secondary | ICD-10-CM | POA: Insufficient documentation

## 2011-02-08 DIAGNOSIS — M2560 Stiffness of unspecified joint, not elsewhere classified: Secondary | ICD-10-CM | POA: Insufficient documentation

## 2011-02-08 DIAGNOSIS — R5381 Other malaise: Secondary | ICD-10-CM | POA: Insufficient documentation

## 2011-02-08 DIAGNOSIS — M25519 Pain in unspecified shoulder: Secondary | ICD-10-CM | POA: Insufficient documentation

## 2011-02-08 DIAGNOSIS — IMO0001 Reserved for inherently not codable concepts without codable children: Secondary | ICD-10-CM | POA: Insufficient documentation

## 2011-02-13 ENCOUNTER — Ambulatory Visit: Payer: PRIVATE HEALTH INSURANCE | Admitting: Physical Therapy

## 2011-02-20 ENCOUNTER — Ambulatory Visit: Payer: PRIVATE HEALTH INSURANCE | Admitting: Physical Therapy

## 2011-02-22 ENCOUNTER — Ambulatory Visit (INDEPENDENT_AMBULATORY_CARE_PROVIDER_SITE_OTHER): Payer: PRIVATE HEALTH INSURANCE | Admitting: Pulmonary Disease

## 2011-02-22 DIAGNOSIS — J449 Chronic obstructive pulmonary disease, unspecified: Secondary | ICD-10-CM

## 2011-02-22 DIAGNOSIS — G4730 Sleep apnea, unspecified: Secondary | ICD-10-CM

## 2011-02-22 NOTE — Patient Instructions (Addendum)
Keep going with the rehab program Use your humidifier If phlegm stays yellow / green or fever, call for an antibiotic Call when ready for sleep study

## 2011-02-22 NOTE — Progress Notes (Signed)
  Subjective:    Patient ID: [name] [name], female    DOB: 11-04-1950, 60 y.o.   MRN: [id number]  HPI PCP - Dr Caryn Bee   60 /F, ex- smoker, with gold stg 2 COPD on O2 & likely, obstructive sleep apnea . Quit smoking since 9/09   Hospitalised 9/09 >> cor pulmonale & hypercarbic RF requiring mechanical ventilation. Did not tolerate CPAP after extubation.  Echo >> RV overload (new compared to old echoes)  Ct angio > no PE  cardiac MRI >> 6 x 9-mm sessile mass in the atrial septum. 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 There was mild left atrial enlargement and no evidence of ASD or VSD. Calculated ejection fraction was 54%. This has not been felt to be contributory to pulmonary hypertension by Cards.  4/10 PFTs >> moderate obstruction, FEV1 48%, small airways 12%  PSG in 2/07 showed mild- mod OSA   Hosp visit feb '12. For RML CAP.    02/22/2011 Pt here for 3 mnth follow up. c/o productive cough worse in mornings with yellow mucus, increased SOB with exertion, wheezing Has quit working & applied for disability No edema Compliant with O2   Review of Systems Patient denies significant dyspnea,cough, hemoptysis,  chest pain, palpitations, pedal edema, orthopnea, paroxysmal nocturnal dyspnea, lightheadedness, nausea, vomiting, abdominal or  leg pains      Objective:   Physical Exam Gen. Pleasant, well-nourished, in no distress ENT - no lesions, no post nasal drip Neck: No JVD, no thyromegaly, no carotid bruits Lungs: no use of accessory muscles, no dullness to percussion, clear without rales or rhonchi  Cardiovascular: Rhythm regular, heart sounds  normal, no murmurs or gallops, no peripheral edema Musculoskeletal: No deformities, no cyanosis or clubbing         Assessment & Plan:

## 2011-02-26 ENCOUNTER — Encounter: Payer: Self-pay | Admitting: Pulmonary Disease

## 2011-02-26 NOTE — Assessment & Plan Note (Signed)
Coming around to the fact that she will need CPAP. Did not tolerate this in hospital. She will c all back when ready

## 2011-02-26 NOTE — Assessment & Plan Note (Signed)
Ct rehab If sputum is colored or breathing worse, she will call for ABx Ct O2 use cta dvair, spiriva

## 2011-03-01 ENCOUNTER — Ambulatory Visit: Payer: PRIVATE HEALTH INSURANCE | Admitting: Physical Therapy

## 2011-03-07 ENCOUNTER — Encounter: Payer: PRIVATE HEALTH INSURANCE | Admitting: Physical Therapy

## 2011-03-14 ENCOUNTER — Ambulatory Visit: Payer: PRIVATE HEALTH INSURANCE | Attending: Orthopedic Surgery | Admitting: Physical Therapy

## 2011-03-14 DIAGNOSIS — M2560 Stiffness of unspecified joint, not elsewhere classified: Secondary | ICD-10-CM | POA: Insufficient documentation

## 2011-03-14 DIAGNOSIS — M542 Cervicalgia: Secondary | ICD-10-CM | POA: Insufficient documentation

## 2011-03-14 DIAGNOSIS — R5381 Other malaise: Secondary | ICD-10-CM | POA: Insufficient documentation

## 2011-03-14 DIAGNOSIS — M25519 Pain in unspecified shoulder: Secondary | ICD-10-CM | POA: Insufficient documentation

## 2011-03-14 DIAGNOSIS — IMO0001 Reserved for inherently not codable concepts without codable children: Secondary | ICD-10-CM | POA: Insufficient documentation

## 2011-03-22 ENCOUNTER — Ambulatory Visit: Payer: PRIVATE HEALTH INSURANCE | Admitting: Physical Therapy

## 2011-04-06 LAB — CREATININE, SERUM
GFR calc Af Amer: >60
GFR calc non Af Amer: >60

## 2011-04-06 LAB — BUN: BUN: 8

## 2011-04-09 LAB — BASIC METABOLIC PANEL
BUN: 15
BUN: 16
CO2: 30
CO2: 31
Calcium: 8.7
Calcium: 9
Chloride: 99
Creatinine, Ser: 0.47
GFR calc Af Amer: >60
GFR calc Af Amer: >60
GFR calc Af Amer: >60
GFR calc Af Amer: >60
GFR calc non Af Amer: >60
GFR calc non Af Amer: >60
GFR calc non Af Amer: >60
Glucose, Bld: 124 — ABNORMAL HIGH
Glucose, Bld: 135 — ABNORMAL HIGH
Potassium: 3.5
Potassium: 3.8
Potassium: 3.8
Potassium: 4
Sodium: 129 — ABNORMAL LOW
Sodium: 137
Sodium: 144

## 2011-04-09 LAB — BLOOD GAS, ARTERIAL
Acid-Base Excess: 5.9 — ABNORMAL HIGH
Acid-Base Excess: 7.6 — ABNORMAL HIGH
Acid-Base Excess: 7.7 — ABNORMAL HIGH
Acid-Base Excess: 8.4 — ABNORMAL HIGH
Bicarbonate: 31.6 — ABNORMAL HIGH
Delivery systems: POSITIVE
FIO2: 0.4
FIO2: 0.4
FIO2: 0.4
MECHVT: 500
Mode: POSITIVE
O2 Saturation: 92.6
PEEP: 5
Patient temperature: 98.6
Patient temperature: 98.6
Patient temperature: 98.6
Pressure support: 5
Pressure support: 5
Pressure support: 7
TCO2: 31.8
TCO2: 33.4
TCO2: 33.5
TCO2: 34.4
TCO2: 35.5
pCO2 arterial: 47.7 — ABNORMAL HIGH
pCO2 arterial: 49 — ABNORMAL HIGH
pCO2 arterial: 55.1 — ABNORMAL HIGH
pCO2 arterial: 58.1
pCO2 arterial: 59
pH, Arterial: 7.354
pH, Arterial: 7.371
pO2, Arterial: 88.5
pO2, Arterial: 99.2

## 2011-04-09 LAB — CBC
HCT: 48.2 — ABNORMAL HIGH
HCT: 49.6 — ABNORMAL HIGH
HCT: 49.6 — ABNORMAL HIGH
HCT: 50.3 — ABNORMAL HIGH
Hemoglobin: 15.4 — ABNORMAL HIGH
Hemoglobin: 15.8 — ABNORMAL HIGH
Hemoglobin: 15.9 — ABNORMAL HIGH
MCHC: 31.5
MCHC: 31.9
MCHC: 32
MCV: 85.3
MCV: 85.9
Platelets: 255
RBC: 5.61 — ABNORMAL HIGH
RBC: 5.72 — ABNORMAL HIGH
RBC: 5.81 — ABNORMAL HIGH
RBC: 5.86 — ABNORMAL HIGH
RDW: 17.3 — ABNORMAL HIGH
RDW: 17.6 — ABNORMAL HIGH
RDW: 17.8 — ABNORMAL HIGH
WBC: 7.3
WBC: 7.6

## 2011-04-09 LAB — GLUCOSE, CAPILLARY
Glucose-Capillary: 108 — ABNORMAL HIGH
Glucose-Capillary: 109 — ABNORMAL HIGH
Glucose-Capillary: 113 — ABNORMAL HIGH
Glucose-Capillary: 126 — ABNORMAL HIGH
Glucose-Capillary: 130 — ABNORMAL HIGH
Glucose-Capillary: 142 — ABNORMAL HIGH
Glucose-Capillary: 146 — ABNORMAL HIGH
Glucose-Capillary: 154 — ABNORMAL HIGH
Glucose-Capillary: 182 — ABNORMAL HIGH
Glucose-Capillary: 188 — ABNORMAL HIGH
Glucose-Capillary: 205 — ABNORMAL HIGH
Glucose-Capillary: 89

## 2011-04-09 LAB — B-NATRIURETIC PEPTIDE (CONVERTED LAB): Pro B Natriuretic peptide (BNP): 128 — ABNORMAL HIGH

## 2011-04-09 LAB — CARDIAC PANEL(CRET KIN+CKTOT+MB+TROPI): Total CK: 12

## 2011-04-10 LAB — BASIC METABOLIC PANEL
BUN: 13
CO2: 34 — ABNORMAL HIGH
CO2: 34 — ABNORMAL HIGH
Calcium: 8.8
Chloride: 97
Creatinine, Ser: 0.56
Creatinine, Ser: 0.57
GFR calc Af Amer: >60
GFR calc non Af Amer: >60
Glucose, Bld: 73
Potassium: 3.1 — ABNORMAL LOW
Sodium: 138

## 2011-04-10 LAB — GLUCOSE, CAPILLARY
Glucose-Capillary: 110 — ABNORMAL HIGH
Glucose-Capillary: 124 — ABNORMAL HIGH
Glucose-Capillary: 157 — ABNORMAL HIGH

## 2011-06-28 ENCOUNTER — Ambulatory Visit (INDEPENDENT_AMBULATORY_CARE_PROVIDER_SITE_OTHER): Payer: PRIVATE HEALTH INSURANCE | Admitting: Pulmonary Disease

## 2011-06-28 ENCOUNTER — Encounter: Payer: Self-pay | Admitting: Pulmonary Disease

## 2011-06-28 VITALS — BP 150/82 | HR 86 | Temp 98.5°F | Ht 63.5 in | Wt 211.4 lb

## 2011-06-28 DIAGNOSIS — G4730 Sleep apnea, unspecified: Secondary | ICD-10-CM

## 2011-06-28 DIAGNOSIS — J449 Chronic obstructive pulmonary disease, unspecified: Secondary | ICD-10-CM

## 2011-06-28 NOTE — Assessment & Plan Note (Signed)
Mild- mod OSA in 2/07, has gained wt since , but does not want to revisit the issue right now Consider home study in the future although sub-optimal?

## 2011-06-28 NOTE — Patient Instructions (Signed)
Will make referral to pulm rehab - exercise program OK to stay off lasix Take extra strength tylenol instead of advil

## 2011-06-28 NOTE — Progress Notes (Signed)
  Subjective:    Patient ID: [name] [name], female    DOB: Feb 09, 1951, 60 y.o.   MRN: [id number]  HPI  PCP - Dr Caryn Bee   60 /F, ex- smoker, with gold stg 2 COPD on O2 & likely, obstructive sleep apnea .  Quit smoking since 9/09  Hospitalised 9/09 >> cor pulmonale & hypercarbic RF requiring mechanical ventilation. Did not tolerate CPAP after extubation.  Echo >> RV overload (new compared to old echoes)  Ct angio > no PE  cardiac MRI >> 6 x 9-mm sessile mass in the atrial septum. 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 There was mild left atrial enlargement and no evidence of ASD or VSD. Calculated ejection fraction was 54%. This has not been felt to be contributory to pulmonary hypertension by Cards.  4/10 PFTs >> moderate obstruction, FEV1 48%, small airways 12%  PSG in 2/07 showed mild- mod OSA  Hosp visit feb '12. For RML CAP.    06/28/2011  visit Has quit working &  gotten disability , Compliant with O2 - did not want portable concentrator Takes advil every night for aches & pains. Took flu shot Husband still smokes, HbA1c was 11 >> started on lantus   Review of Systems Patient denies significant dyspnea,cough, hemoptysis,  chest pain, palpitations, pedal edema, orthopnea, paroxysmal nocturnal dyspnea, lightheadedness, nausea, vomiting, abdominal or  leg pains      Objective:   Physical Exam  Gen. Pleasant, well-nourished, in no distress on 2l o2 ENT - no lesions, no post nasal drip Neck: No JVD, no thyromegaly, no carotid bruits Lungs: no use of accessory muscles, no dullness to percussion, clear without rales or rhonchi  Cardiovascular: Rhythm regular, heart sounds  normal, no murmurs or gallops, no peripheral edema Musculoskeletal: No deformities, no cyanosis or clubbing         Assessment & Plan:

## 2011-06-28 NOTE — Assessment & Plan Note (Signed)
I convinced her to enroll into pulm rehab I think this will help her more with her COPD & general health maintenance, perhaps facilitate some wt loss too.

## 2011-10-24 ENCOUNTER — Ambulatory Visit (INDEPENDENT_AMBULATORY_CARE_PROVIDER_SITE_OTHER): Payer: PRIVATE HEALTH INSURANCE | Admitting: Adult Health

## 2011-10-24 ENCOUNTER — Encounter: Payer: Self-pay | Admitting: Adult Health

## 2011-10-24 ENCOUNTER — Ambulatory Visit (INDEPENDENT_AMBULATORY_CARE_PROVIDER_SITE_OTHER)
Admission: RE | Admit: 2011-10-24 | Discharge: 2011-10-24 | Disposition: A | Discharge status: Home / Self Care | Payer: PRIVATE HEALTH INSURANCE | Source: Ambulatory Visit | Attending: Adult Health | Admitting: Adult Health

## 2011-10-24 VITALS — BP 138/90 | HR 104 | Temp 98.8°F | Ht 63.5 in | Wt 214.0 lb

## 2011-10-24 DIAGNOSIS — J449 Chronic obstructive pulmonary disease, unspecified: Secondary | ICD-10-CM

## 2011-10-24 NOTE — Progress Notes (Signed)
  Subjective:    Patient ID: [name] [name], female    DOB: May 14, 1951, 61 y.o.   MRN: [id number]  HPI   PCP - Dr Caryn Bee   60 /F, ex- smoker, with gold stg 2 COPD on O2 & likely, obstructive sleep apnea .  Quit smoking since 9/09  Hospitalised 9/09 >> cor pulmonale & hypercarbic RF requiring mechanical ventilation. Did not tolerate CPAP after extubation.  Echo >> RV overload (new compared to old echoes)  Ct angio > no PE  cardiac MRI >> 6 x 9-mm sessile mass in the atrial septum. 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 There was mild left atrial enlargement and no evidence of ASD or VSD. Calculated ejection fraction was 54%. This has not been felt to be contributory to pulmonary hypertension by Cards.  4/10 PFTs >> moderate obstruction, FEV1 48%, small airways 12%  PSG in 2/07 showed mild- mod OSA  Hosp visit feb '12. For RML CAP.    06/28/11   visit Has quit working &  gotten disability , Compliant with O2 - did not want portable concentrator Takes advil every night for aches & pains. Took flu shot Husband still smokes, HbA1c was 11 >> started on lantus  10/24/2011 Follow up  Patient returns for her 4 month followup. States she's been doing well and is currently at her baseline. She denies any increased shortness of breath, cough, or wheezing. No emergency room or hospitalization since last visit.  She continues on Advair and Spiriva without any missed doses. Uses xopenex rarely.    Review of Systems Constitutional:   No  weight loss, night sweats,  Fevers, chills,  +fatigue, or  lassitude.  HEENT:   No headaches,  Difficulty swallowing,  Tooth/dental problems, or  Sore throat,                No sneezing, itching, ear ache, nasal congestion, post nasal drip,   CV:  No chest pain,  Orthopnea, PND, swelling in lower extremities, anasarca, dizziness, palpitations, syncope.   GI  No heartburn, indigestion, abdominal pain, nausea,  vomiting, diarrhea, change in bowel habits, loss of appetite, bloody stools.   Resp:    No chest wall deformity  Skin: no rash or lesions.  GU: no dysuria, change in color of urine, no urgency or frequency.  No flank pain, no hematuria   MS:  No joint pain or swelling.  No decreased range of motion.  No back pain.  Psych:  No change in mood or affect. No depression or anxiety.  No memory loss.          Objective:   Physical Exam   Gen. Pleasant, well-nourished, in no distress on 2l o2 ENT - no lesions, no post nasal drip Neck: No JVD, no thyromegaly, no carotid bruits Lungs: no use of accessory muscles, no dullness to percussion, clear without rales or rhonchi  Cardiovascular: Rhythm regular, heart sounds  normal, no murmurs or gallops, no peripheral edema Musculoskeletal: No deformities, no cyanosis or clubbing         Assessment & Plan:

## 2011-10-24 NOTE — Patient Instructions (Signed)
Continue on Advair and Spiriva. Follow back with Dr. Vassie Loll in 4 months and as needed. May use saline nasal spray and saline gel for nasal congestion. I will call with x-ray results.

## 2011-10-24 NOTE — Assessment & Plan Note (Signed)
Compensated on present regimen.  Routine cxr   Plan Continue on Advair and Spiriva. Follow back with Dr. Vassie Loll in 4 months and as needed. May use saline nasal spray and saline gel for nasal congestion. I will call with x-ray results.

## 2012-02-29 ENCOUNTER — Ambulatory Visit (INDEPENDENT_AMBULATORY_CARE_PROVIDER_SITE_OTHER): Payer: PRIVATE HEALTH INSURANCE | Admitting: Pulmonary Disease

## 2012-02-29 ENCOUNTER — Encounter: Payer: Self-pay | Admitting: Pulmonary Disease

## 2012-02-29 VITALS — BP 128/78 | HR 82 | Temp 98.8°F | Ht 63.0 in | Wt 214.0 lb

## 2012-02-29 DIAGNOSIS — J4489 Other specified chronic obstructive pulmonary disease: Secondary | ICD-10-CM

## 2012-02-29 DIAGNOSIS — J449 Chronic obstructive pulmonary disease, unspecified: Secondary | ICD-10-CM

## 2012-02-29 DIAGNOSIS — G4730 Sleep apnea, unspecified: Secondary | ICD-10-CM

## 2012-02-29 MED ORDER — AZITHROMYCIN 250 MG PO TABS: ORAL_TABLET | ORAL | Status: DC

## 2012-02-29 MED ORDER — ALBUTEROL SULFATE (2.5 MG/3ML) 0.083% IN NEBU: 2.5000 mg | INHALATION_SOLUTION | RESPIRATORY_TRACT | Status: DC | PRN

## 2012-02-29 NOTE — Patient Instructions (Addendum)
Rescue inhaler sample Stay on advair spiriva We will request pulm rehab at [facility] to contact you Take mucinex twice daily Zpak Rx sent if sputum is colored

## 2012-02-29 NOTE — Progress Notes (Signed)
  Subjective:    Patient ID: [name] [name], female    DOB: 1951/03/25, 61 y.o.   MRN: [id number]  HPI PCP - Dr Caryn Bee   30 /F, ex- smoker, with gold stg 2 COPD on O2 & likely, obstructive sleep apnea .  Quit smoking since 9/09  Hospitalised 9/09 >> cor pulmonale & hypercarbic RF requiring mechanical ventilation. Did not tolerate CPAP after extubation.  Echo >> RV overload (new compared to old echoes)  Ct angio > no PE  cardiac MRI >> 6 x 9-mm sessile mass in the atrial septum. 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 There was mild left atrial enlargement and no evidence of ASD or VSD. Calculated ejection fraction was 54%. This has not been felt to be contributory to pulmonary hypertension by Cards.  4/10 PFTs >> moderate obstruction, FEV1 48%, small airways 12%  PSG in 2/07 showed mild- mod OSA  Hosp visit feb '12. For RML CAP.  06/28/11 visit  Has quit working & gotten disability , Compliant with O2 - did not want portable concentrator  Takes advil every night for aches & pains.  Took flu shot  Husband still smokes, HbA1c was 11 >> started on lantus   02/29/2012  4 month followup. States she's been doing well and is currently at her baseline. She denies any increased shortness of breath, cough, or wheezing. No emergency room or hospitalization since last visit. She continues on Advair and Spiriva without any missed doses.  Uses xopenex rarely.   c/o cough w/ yellow phlem in the mornings and at night, ribs/back/chest feels sore, wheezing, slight increase SOB w/ exertion. pt would like a breathing tx today C/o neck pain & headaches CAT score 32 Did not get call from pulm rehab CXR 4/13 wnl  Review of Systems neg for any significant sore throat, dysphagia, itching, sneezing, nasal congestion or excess/ purulent secretions, fever, chills, sweats, unintended wt loss, pleuritic or exertional cp, hempoptysis, orthopnea pnd or change in  chronic leg swelling. Also denies presyncope, palpitations, heartburn, abdominal pain, nausea, vomiting, diarrhea or change in bowel or urinary habits, dysuria,hematuria, rash, arthralgias, visual complaints, headache, numbness weakness or ataxia.     Objective:   Physical Exam  Gen. Pleasant, obese, in no distress ENT - no lesions, no post nasal drip Neck: No JVD, no thyromegaly, no carotid bruits Lungs: no use of accessory muscles, no dullness to percussion, decreased without rales or rhonchi  Cardiovascular: Rhythm regular, heart sounds  normal, no murmurs or gallops, no peripheral edema Musculoskeletal: No deformities, no cyanosis or clubbing , no tremors       Assessment & Plan:

## 2012-03-02 NOTE — Assessment & Plan Note (Signed)
She does not want testing so will not pursue this anymore.

## 2012-03-02 NOTE — Assessment & Plan Note (Addendum)
Stable clinically, ? Early flare Rpt spirometry next visit with me  Rescue inhaler sample Stay on advair spiriva We will request pulm rehab at [facility] to contact you Take mucinex twice daily Zpak Rx sent if sputum is colored

## 2012-06-10 LAB — BASIC METABOLIC PANEL
BUN: 11 mg/dL (ref 4–21)
Creatinine: 0.6 mg/dL (ref 0.5–1.1)
Glucose: 227 mg/dL
Potassium: 4 mmol/L (ref 3.4–5.3)

## 2012-06-10 LAB — HEPATIC FUNCTION PANEL
ALT: 34 U/L (ref 7–35)
AST: 31 U/L (ref 13–35)
Alkaline Phosphatase: 67 U/L (ref 25–125)
Bilirubin, Total: 0.6 mg/dL

## 2012-06-10 LAB — HEMOGLOBIN A1C: Hgb A1c MFr Bld: 8.7 % — AB (ref 4.0–6.0)

## 2012-07-16 ENCOUNTER — Ambulatory Visit: Payer: PRIVATE HEALTH INSURANCE | Admitting: Adult Health

## 2012-07-22 ENCOUNTER — Encounter: Payer: Self-pay | Admitting: Adult Health

## 2012-07-22 ENCOUNTER — Ambulatory Visit (INDEPENDENT_AMBULATORY_CARE_PROVIDER_SITE_OTHER): Payer: PRIVATE HEALTH INSURANCE | Admitting: Adult Health

## 2012-07-22 VITALS — BP 130/74 | HR 76 | Temp 98.6°F | Ht 63.5 in | Wt 215.0 lb

## 2012-07-22 DIAGNOSIS — J449 Chronic obstructive pulmonary disease, unspecified: Secondary | ICD-10-CM

## 2012-07-22 DIAGNOSIS — J4489 Other specified chronic obstructive pulmonary disease: Secondary | ICD-10-CM

## 2012-07-22 NOTE — Assessment & Plan Note (Signed)
Compensated on present regimen. Encouraged on remain active. Followup in 4 months and as needed

## 2012-07-22 NOTE — Patient Instructions (Addendum)
Continue on Advair and Spiriva. Follow back with Dr. Vassie Loll in 4 months and as needed. May use saline nasal spray and saline gel for nasal congestion.

## 2012-07-22 NOTE — Progress Notes (Signed)
Subjective:    Patient ID: [name] [name], female    DOB: [birth year], 62 y.o.   MRN: [id number]  HPI  PCP - Dr Caryn Bee   65 /F, ex- smoker, with gold stg 2 COPD on O2 & likely, obstructive sleep apnea .  Quit smoking since 9/09  Hospitalised 9/09 >> cor pulmonale & hypercarbic RF requiring mechanical ventilation. Did not tolerate CPAP after extubation.  Echo >> RV overload (new compared to old echoes)  Ct angio > no PE  cardiac MRI >> 6 x 9-mm sessile mass in the atrial septum. 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 There was mild left atrial enlargement and no evidence of ASD or VSD. Calculated ejection fraction was 54%. This has not been felt to be contributory to pulmonary hypertension by Cards.  4/10 PFTs >> moderate obstruction, FEV1 48%, small airways 12%  PSG in 2/07 showed mild- mod OSA  Hosp visit feb '12. For RML CAP.   06/28/11 visit  Has quit working & gotten disability , Compliant with O2 - did not want portable concentrator  Takes advil every night for aches & pains.  Took flu shot  Husband still smokes, HbA1c was 11 >> started on lantus   02/29/12   4 month followup. States she's been doing well and is currently at her baseline. She denies any increased shortness of breath, cough, or wheezing. No emergency room or hospitalization since last visit. She continues on Advair and Spiriva without any missed doses.  Uses xopenex rarely.   c/o cough w/ yellow phlem in the mornings and at night, ribs/back/chest feels sore, wheezing, slight increase SOB w/ exertion. pt would like a breathing tx today C/o neck pain & headaches CAT score 32 Did not get call from pulm rehab CXR 4/13 wnl >>refer to pulm rehab   07/22/2012 Follow up  Patient returns for a four-month followup for her COPD. She is currently on Advair and Spiriva. Reports that she has been doing well without any flare of cough and wheezing. She denies any emergency  room or hospitalizations. Since last visit. She was referred to pulmonary rehabilitation however, her co-pay was too high. We discussed her activity level with encouragement to remain active She denies any hemoptysis, orthopnea, PND, or weight loss. Last chest x-ray April 2013, with no acute changes Does have some occasional nasal drip, and drainage      Review of Systems Constitutional:   No  weight loss, night sweats,  Fevers, chills, fatigue, or  lassitude.  HEENT:   No headaches,  Difficulty swallowing,  Tooth/dental problems, or  Sore throat,                No sneezing, itching, ear ache,  +nasal congestion, post nasal drip,   CV:  No chest pain,  Orthopnea, PND, swelling in lower extremities, anasarca, dizziness, palpitations, syncope.   GI  No heartburn, indigestion, abdominal pain, nausea, vomiting, diarrhea, change in bowel habits, loss of appetite, bloody stools.   Resp:  No coughing up of blood.   No chest wall deformity  Skin: no rash or lesions.  GU: no dysuria, change in color of urine, no urgency or frequency.  No flank pain, no hematuria   MS:  No joint swelling.  No decreased range of motion.    Psych:  No change in mood or affect. No depression or anxiety.  No memory loss.  Objective:   Physical Exam   Gen. Pleasant, obese, in no distress ENT - no lesions, no post nasal drip Neck: No JVD, no thyromegaly, no carotid bruits Lungs: no use of accessory muscles, no dullness to percussion, decreased BS in bases  Cardiovascular: Rhythm regular, heart sounds  normal, no murmurs or gallops, no peripheral edema Musculoskeletal: No deformities, no cyanosis or clubbing , no tremors       Assessment & Plan:

## 2012-09-18 ENCOUNTER — Encounter: Payer: Self-pay | Admitting: *Deleted

## 2012-09-23 ENCOUNTER — Ambulatory Visit: Payer: Self-pay | Admitting: Physician Assistant

## 2012-09-29 ENCOUNTER — Telehealth: Payer: Self-pay | Admitting: Pulmonary Disease

## 2012-09-29 NOTE — Telephone Encounter (Signed)
I spoke with the pt and she is c/o increased swelling in both ankles x 2 days. Pt denies any SOB at this time. Pt states she has also been drinking a lot of beer recently and wanted to know if this could cause the swelling. I advised that the beer would effect swelling and that she should stop it and watch all salt intake in her diet. I also advised to keep legs elevated as much as possible. Pt also has lasix on her med list to use PRN but the pt has not taken this since swelling has been worse. I advised her to take lasix as directed. Pt has an appt with her PCP tomorrow so I advised her to f/u with the about the swelling. Carron Curie, CMA

## 2012-09-30 ENCOUNTER — Ambulatory Visit (INDEPENDENT_AMBULATORY_CARE_PROVIDER_SITE_OTHER): Payer: PRIVATE HEALTH INSURANCE | Admitting: Physician Assistant

## 2012-09-30 ENCOUNTER — Encounter: Payer: Self-pay | Admitting: Physician Assistant

## 2012-09-30 ENCOUNTER — Other Ambulatory Visit: Payer: PRIVATE HEALTH INSURANCE

## 2012-09-30 VITALS — BP 158/89 | HR 94 | Temp 97.1°F | Ht 64.0 in | Wt 218.4 lb

## 2012-09-30 DIAGNOSIS — E785 Hyperlipidemia, unspecified: Secondary | ICD-10-CM

## 2012-09-30 DIAGNOSIS — J449 Chronic obstructive pulmonary disease, unspecified: Secondary | ICD-10-CM

## 2012-09-30 DIAGNOSIS — J441 Chronic obstructive pulmonary disease with (acute) exacerbation: Secondary | ICD-10-CM

## 2012-09-30 DIAGNOSIS — M542 Cervicalgia: Secondary | ICD-10-CM

## 2012-09-30 DIAGNOSIS — E119 Type 2 diabetes mellitus without complications: Secondary | ICD-10-CM

## 2012-09-30 DIAGNOSIS — I10 Essential (primary) hypertension: Secondary | ICD-10-CM

## 2012-09-30 DIAGNOSIS — F411 Generalized anxiety disorder: Secondary | ICD-10-CM

## 2012-09-30 DIAGNOSIS — K219 Gastro-esophageal reflux disease without esophagitis: Secondary | ICD-10-CM

## 2012-09-30 DIAGNOSIS — R5381 Other malaise: Secondary | ICD-10-CM

## 2012-09-30 DIAGNOSIS — E1059 Type 1 diabetes mellitus with other circulatory complications: Secondary | ICD-10-CM

## 2012-09-30 DIAGNOSIS — J309 Allergic rhinitis, unspecified: Secondary | ICD-10-CM

## 2012-09-30 LAB — COMPREHENSIVE METABOLIC PANEL
AST: 22 U/L (ref 0–37)
Albumin: 4.4 g/dL (ref 3.5–5.2)
Alkaline Phosphatase: 65 U/L (ref 39–117)
BUN: 11 mg/dL (ref 6–23)
Calcium: 9.5 mg/dL (ref 8.4–10.5)
Chloride: 94 mEq/L — ABNORMAL LOW (ref 96–112)
Potassium: 4.5 mEq/L (ref 3.5–5.3)
Sodium: 138 mEq/L (ref 135–145)
Total Protein: 6.7 g/dL (ref 6.0–8.3)

## 2012-09-30 LAB — THYROID PANEL WITH TSH: T3 Uptake: 36.8 % (ref 22.5–37.0)

## 2012-09-30 LAB — POCT CBC
Granulocyte percent: 69.9 %G (ref 37–80)
Hemoglobin: 13.3 g/dL (ref 12.2–16.2)
MCH, POC: 28.1 pg (ref 27–31.2)
MCV: 87.6 fL (ref 80–97)
MPV: 8 fL (ref 0–99.8)
RBC: 4.7 M/uL (ref 4.04–5.48)

## 2012-09-30 MED ORDER — FUROSEMIDE 40 MG PO TABS: 40.0000 mg | ORAL_TABLET | Freq: Every day | ORAL | Status: DC | PRN

## 2012-09-30 MED ORDER — COLESEVELAM HCL 625 MG PO TABS: 625.0000 mg | ORAL_TABLET | Freq: Three times a day (TID) | ORAL | Status: DC

## 2012-09-30 MED ORDER — FLUTICASONE PROPIONATE 50 MCG/ACT NA SUSP: 2.0000 | Freq: Every day | NASAL | Status: DC | PRN

## 2012-09-30 MED ORDER — PANTOPRAZOLE SODIUM 40 MG PO TBEC: 40.0000 mg | DELAYED_RELEASE_TABLET | Freq: Every day | ORAL | Status: DC

## 2012-09-30 MED ORDER — CANDESARTAN CILEXETIL-HCTZ 16-12.5 MG PO TABS: 1.0000 | ORAL_TABLET | Freq: Every day | ORAL | Status: DC

## 2012-09-30 MED ORDER — AMLODIPINE BESYLATE 10 MG PO TABS: 10.0000 mg | ORAL_TABLET | Freq: Every day | ORAL | Status: DC

## 2012-09-30 MED ORDER — TIOTROPIUM BROMIDE MONOHYDRATE 18 MCG IN CAPS: 18.0000 ug | ORAL_CAPSULE | Freq: Every day | RESPIRATORY_TRACT | Status: DC

## 2012-09-30 MED ORDER — ALBUTEROL SULFATE (2.5 MG/3ML) 0.083% IN NEBU: 2.5000 mg | INHALATION_SOLUTION | RESPIRATORY_TRACT | Status: DC | PRN

## 2012-09-30 MED ORDER — INSULIN ASPART 100 UNIT/ML ~~LOC~~ SOLN: 8.0000 [IU] | Freq: Three times a day (TID) | SUBCUTANEOUS | Status: DC

## 2012-09-30 MED ORDER — INSULIN GLARGINE 100 UNIT/ML ~~LOC~~ SOLN: 35.0000 [IU] | Freq: Every day | SUBCUTANEOUS | Status: DC

## 2012-09-30 MED ORDER — GABAPENTIN 100 MG PO CAPS: 100.0000 mg | ORAL_CAPSULE | Freq: Three times a day (TID) | ORAL | Status: DC

## 2012-09-30 MED ORDER — METFORMIN HCL 1000 MG PO TABS: 1000.0000 mg | ORAL_TABLET | Freq: Two times a day (BID) | ORAL | Status: DC

## 2012-09-30 MED ORDER — BISOPROLOL FUMARATE 5 MG PO TABS: 5.0000 mg | ORAL_TABLET | Freq: Every day | ORAL | Status: DC

## 2012-09-30 MED ORDER — FLUTICASONE-SALMETEROL 250-50 MCG/DOSE IN AEPB: 1.0000 | INHALATION_SPRAY | Freq: Two times a day (BID) | RESPIRATORY_TRACT | Status: DC

## 2012-09-30 MED ORDER — LEVALBUTEROL HCL 0.63 MG/3ML IN NEBU: 1.0000 | INHALATION_SOLUTION | Freq: Four times a day (QID) | RESPIRATORY_TRACT | Status: DC | PRN

## 2012-09-30 MED ORDER — CETIRIZINE HCL 10 MG PO TABS: 10.0000 mg | ORAL_TABLET | ORAL | Status: DC | PRN

## 2012-09-30 MED ORDER — ROSUVASTATIN CALCIUM 10 MG PO TABS: 10.0000 mg | ORAL_TABLET | Freq: Every day | ORAL | Status: DC

## 2012-09-30 NOTE — Progress Notes (Signed)
Has appointment

## 2012-09-30 NOTE — Progress Notes (Signed)
Subjective:    Patient ID: [name] [name], female    DOB: February 10, 1951, 62 y.o.   MRN: [id number]  HPI Comments: Had several days while legs were swelling, she got confused occasionally  Regularly scheduled diabetic recheck    Review of Systems  Cardiovascular: Positive for leg swelling.       Used lasix which helped to reduced  All other systems reviewed and are negative.       Objective:   Physical Exam  Constitutional: She is oriented to person, place, and time. She appears well-developed and well-nourished.  HENT:  Head: Normocephalic and atraumatic.  Right Ear: External ear normal.  Left Ear: External ear normal.  Nose: Nose normal.  Mouth/Throat: Oropharynx is clear and moist.  Eyes: Conjunctivae and EOM are normal.  Neck: Normal range of motion. Neck supple.  Cardiovascular: Normal rate, regular rhythm and normal heart sounds.   Pulmonary/Chest: No respiratory distress. She has no wheezes. She exhibits no tenderness.  Portable O2  Abdominal: Soft. Bowel sounds are normal.  Musculoskeletal:  Neck stiffness R>L  Neurological: She is alert and oriented to person, place, and time.  Skin: Skin is warm and dry.  Psychiatric: She has a normal mood and affect. Her behavior is normal. Judgment and thought content normal.          Assessment & Plan:  Diabetes COPD Anxiety GERD Hyperlipidemia HTN Allergic rhinitis No orders of the defined types were placed in this encounter.   Meds ordered this encounter  Medications  . albuterol (PROVENTIL) (2.5 MG/3ML) 0.083% nebulizer solution    Sig: Take 3 mLs (2.5 mg total) by nebulization every 4 (four) hours as needed.    Dispense:  30 vial    Refill:  0    Order Specific Question:  Supervising Provider    Answer:  Ernestina Penna [[id number]]  . pantoprazole (PROTONIX) 40 MG tablet    Sig: Take 1 tablet (40 mg total) by mouth daily.    Dispense:  30 tablet    Refill:  5    Order Specific Question:  Supervising  Provider    Answer:  Ernestina Penna [[id number]]  . amLODipine (NORVASC) 10 MG tablet    Sig: Take 1 tablet (10 mg total) by mouth daily.    Dispense:  30 tablet    Refill:  5    Order Specific Question:  Supervising Provider    Answer:  Ernestina Penna [[id number]]  . bisoprolol (ZEBETA) 5 MG tablet    Sig: Take 1 tablet (5 mg total) by mouth daily.    Dispense:  30 tablet    Refill:  5    Order Specific Question:  Supervising Provider    Answer:  Ernestina Penna [[id number]]  . candesartan-hydrochlorothiazide (ATACAND HCT) 16-12.5 MG per tablet    Sig: Take 1 tablet by mouth daily.    Dispense:  30 tablet    Refill:  5    Order Specific Question:  Supervising Provider    Answer:  Ernestina Penna [[id number]]  . cetirizine (ZYRTEC) 10 MG tablet    Sig: Take 1 tablet (10 mg total) by mouth as needed.    Dispense:  30 tablet    Refill:  5    Order Specific Question:  Supervising Provider    Answer:  Ernestina Penna [[id number]]  . colesevelam (WELCHOL) 625 MG tablet    Sig: Take 1 tablet (625 mg total) by mouth 3 (three) times daily.  Dispense:  90 tablet    Refill:  5    Order Specific Question:  Supervising Provider    Answer:  Ernestina Penna [[id number]]  . fluticasone (FLONASE) 50 MCG/ACT nasal spray    Sig: Place 2 sprays into the nose daily as needed for rhinitis or allergies.    Dispense:  16 g    Refill:  5    Order Specific Question:  Supervising Provider    Answer:  Ernestina Penna [[id number]]  . Fluticasone-Salmeterol (ADVAIR DISKUS) 250-50 MCG/DOSE AEPB    Sig: Inhale 1 puff into the lungs every 12 (twelve) hours.    Dispense:  60 each    Refill:  5    Order Specific Question:  Supervising Provider    Answer:  Ernestina Penna [[id number]]  . furosemide (LASIX) 40 MG tablet    Sig: Take 1 tablet (40 mg total) by mouth daily as needed.    Dispense:  30 tablet    Refill:  5    Order Specific Question:  Supervising Provider    Answer:  Ernestina Penna [[id number]]  . gabapentin (NEURONTIN) 100 MG capsule     Sig: Take 1 capsule (100 mg total) by mouth 3 (three) times daily.    Dispense:  90 capsule    Refill:  5    Order Specific Question:  Supervising Provider    Answer:  Ernestina Penna [[id number]]  . insulin aspart (NOVOLOG) 100 UNIT/ML injection    Sig: Inject 8 Units into the skin 3 (three) times daily before meals.    Dispense:  1 vial    Refill:  5    Order Specific Question:  Supervising Provider    Answer:  Ernestina Penna [[id number]]  . insulin glargine (LANTUS) 100 UNIT/ML injection    Sig: Inject 0.35 mLs (35 Units total) into the skin at bedtime. Prn    Dispense:  10 mL    Refill:  5    Order Specific Question:  Supervising Provider    Answer:  Ernestina Penna [[id number]]  . levalbuterol (XOPENEX) 0.63 MG/3ML nebulizer solution    Sig: Take 3 mLs (0.63 mg total) by nebulization every 6 (six) hours as needed.    Dispense:  3 mL    Refill:  5    Order Specific Question:  Supervising Provider    Answer:  Ernestina Penna [[id number]]  . metFORMIN (GLUCOPHAGE) 1000 MG tablet    Sig: Take 1 tablet (1,000 mg total) by mouth 2 (two) times daily with a meal.    Dispense:  60 tablet    Refill:  5    Order Specific Question:  Supervising Provider    Answer:  Ernestina Penna [[id number]]  . rosuvastatin (CRESTOR) 10 MG tablet    Sig: Take 1 tablet (10 mg total) by mouth daily.    Dispense:  30 tablet    Refill:  5    Order Specific Question:  Supervising Provider    Answer:  Ernestina Penna [[id number]]  . tiotropium (SPIRIVA) 18 MCG inhalation capsule    Sig: Place 1 capsule (18 mcg total) into inhaler and inhale daily.    Dispense:  30 capsule    Refill:  5    Order Specific Question:  Supervising Provider    Answer:  Ernestina Penna [phone]

## 2012-11-27 ENCOUNTER — Ambulatory Visit: Payer: PRIVATE HEALTH INSURANCE | Admitting: Pulmonary Disease

## 2012-12-09 ENCOUNTER — Other Ambulatory Visit: Payer: Self-pay | Admitting: Physician Assistant

## 2013-01-02 ENCOUNTER — Encounter: Payer: Self-pay | Admitting: Pulmonary Disease

## 2013-01-02 ENCOUNTER — Ambulatory Visit (INDEPENDENT_AMBULATORY_CARE_PROVIDER_SITE_OTHER): Payer: Managed Care, Other (non HMO) | Admitting: Pulmonary Disease

## 2013-01-02 ENCOUNTER — Ambulatory Visit (INDEPENDENT_AMBULATORY_CARE_PROVIDER_SITE_OTHER)
Admission: RE | Admit: 2013-01-02 | Discharge: 2013-01-02 | Disposition: A | Discharge status: Home / Self Care | Payer: Managed Care, Other (non HMO) | Source: Ambulatory Visit | Attending: Pulmonary Disease | Admitting: Pulmonary Disease

## 2013-01-02 VITALS — BP 134/74 | HR 92 | Temp 98.6°F | Ht 63.5 in | Wt 220.0 lb

## 2013-01-02 DIAGNOSIS — I279 Pulmonary heart disease, unspecified: Secondary | ICD-10-CM

## 2013-01-02 DIAGNOSIS — J449 Chronic obstructive pulmonary disease, unspecified: Secondary | ICD-10-CM

## 2013-01-02 MED ORDER — FLUTICASONE FUROATE-VILANTEROL 100-25 MCG/INH IN AEPB: 1.0000 | INHALATION_SPRAY | Freq: Every day | RESPIRATORY_TRACT | Status: DC

## 2013-01-02 NOTE — Progress Notes (Signed)
  Subjective:    Patient ID: [name] [name], female    DOB: [birth year], 62 y.o.   MRN: [id number]  HPI  PCP - Dr Caryn Bee   82 /F, ex- smoker, with gold stg 2 COPD on O2 & likely, obstructive sleep apnea .  Quit smoking since 9/09  Hospitalised 9/09 >> cor pulmonale & hypercarbic RF requiring mechanical ventilation. Did not tolerate CPAP after extubation.  Echo >> RV overload (new compared to old echoes)  Ct angio > no PE  cardiac MRI >> 6 x 9-mm sessile mass in the atrial septum. 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 There was mild left atrial enlargement and no evidence of ASD or VSD. Calculated ejection fraction was 54%. This has not been felt to be contributory to pulmonary hypertension by Cards.  4/10 PFTs >> moderate obstruction, FEV1 48%, small airways 12%  PSG in 2/07 showed mild- mod OSA  Hosp visit feb '12. For RML CAP.  Quit working & in 2012 & got disability   01/02/2013 36m FU No emergency room or hospitalization since last visit. Breathing has gotten worse. Reports coughing with production of yellow mucus, increased SOB, chest tightness. Not taking lantus as directed - afraid of hypoglycemia during sleep DId not start Pulm rehab- too expensive CXR - Stable bilateral basilar scarring Has gained some weight, does not take lasix daily  Filed Weights   01/02/13 1017  Weight: 99.791 kg (220 lb)     Review of Systems neg for any significant sore throat, dysphagia, itching, sneezing, nasal congestion or excess/ purulent secretions, fever, chills, sweats, unintended wt loss, pleuritic or exertional cp, hempoptysis, orthopnea pnd or change in chronic leg swelling. Also denies presyncope, palpitations, heartburn, abdominal pain, nausea, vomiting, diarrhea or change in bowel or urinary habits, dysuria,hematuria, rash, arthralgias, visual complaints, headache, numbness weakness or ataxia.     Objective:   Physical Exam  Gen.  Pleasant, obese, in no distress ENT - no lesions, no post nasal drip Neck: No JVD, no thyromegaly, no carotid bruits Lungs: no use of accessory muscles, no dullness to percussion, decreased without rales or rhonchi  Cardiovascular: Rhythm regular, heart sounds  normal, no murmurs or gallops, 1+ peripheral edema Musculoskeletal: No deformities, no cyanosis or clubbing , no tremors       Assessment & Plan:

## 2013-01-02 NOTE — Assessment & Plan Note (Signed)
Trial of BREO instead of spiriva- call for Rx if this works Take lasix daily x 5-7 days- you should lose at least 3-5 lbs Resume lantus as we discussed CXR today 

## 2013-01-02 NOTE — Patient Instructions (Addendum)
Trial of BREO instead of spiriva- call for Rx if this works Take lasix daily x 5-7 days- you should lose at least 3-5 lbs Resume lantus as we discussed CXR today

## 2013-01-04 NOTE — Assessment & Plan Note (Signed)
-  wonder about her compliance with oxygen - emphasized Pedal edema could be effect of amlodipin or worsening cor pulmonale Lasix daily x 5-7 ds

## 2013-04-01 ENCOUNTER — Encounter: Payer: Self-pay | Admitting: Adult Health

## 2013-04-01 ENCOUNTER — Ambulatory Visit (INDEPENDENT_AMBULATORY_CARE_PROVIDER_SITE_OTHER): Payer: Managed Care, Other (non HMO) | Admitting: Adult Health

## 2013-04-01 VITALS — BP 116/72 | HR 73 | Temp 98.5°F | Ht 63.5 in | Wt 214.0 lb

## 2013-04-01 DIAGNOSIS — Z23 Encounter for immunization: Secondary | ICD-10-CM

## 2013-04-01 DIAGNOSIS — J449 Chronic obstructive pulmonary disease, unspecified: Secondary | ICD-10-CM

## 2013-04-01 DIAGNOSIS — I279 Pulmonary heart disease, unspecified: Secondary | ICD-10-CM

## 2013-04-01 NOTE — Progress Notes (Signed)
  Subjective:    Patient ID: [name] [name], female    DOB: [birth year], 62 y.o.   MRN: [id number]  HPI   PCP - Dr Caryn Bee   16 /F, ex- smoker, with gold stg 2 COPD on O2 & likely, obstructive sleep apnea .  Quit smoking since 9/09  Hospitalised 9/09 >> cor pulmonale & hypercarbic RF requiring mechanical ventilation. Did not tolerate CPAP after extubation.  Echo >> RV overload (new compared to old echoes)  Ct angio > no PE  cardiac MRI >> 6 x 9-mm sessile mass in the atrial septum. 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 There was mild left atrial enlargement and no evidence of ASD or VSD. Calculated ejection fraction was 54%. This has not been felt to be contributory to pulmonary hypertension by Cards.  4/10 PFTs >> moderate obstruction, FEV1 48%, small airways 12%  PSG in 2/07 showed mild- mod OSA  Hosp visit feb '12. For RML CAP.  Quit working & in 2012 & got disability   01/02/13  46m FU No emergency room or hospitalization since last visit. Breathing has gotten worse. Reports coughing with production of yellow mucus, increased SOB, chest tightness. Not taking lantus as directed - afraid of hypoglycemia during sleep DId not start Pulm rehab- too expensive CXR - Stable bilateral basilar scarring Has gained some weight, does not take lasix daily >>trial of BREO instead of Spiriva and Lasix rx  CXR chronic changes   04/01/2013 Follow up  3 month follow up COPD.  Reports breathing has been doing well since last ov.  has not been using advair.  CAT = 33 Tried Breo with no perceived benefit . Remains on spiriva.  Does have some nasal stuffiness. No discolored mucus or fever.  No chest pain , orthopnea, increased edema or hemoptysis.  Taking lasix As needed  , does help with leg swelling.   Review of Systems  neg for any significant sore throat, dysphagia, itching, sneezing, nasal congestion or excess/ purulent secretions, fever, chills,  sweats, unintended wt loss, pleuritic or exertional cp, hempoptysis, orthopnea pnd or change in chronic leg swelling. Also denies presyncope, palpitations, heartburn, abdominal pain, nausea, vomiting, diarrhea or change in bowel or urinary habits, dysuria,hematuria, rash, arthralgias, visual complaints, headache, numbness weakness or ataxia.     Objective:   Physical Exam   Gen. Pleasant, obese, in no distress ENT - no lesions, no post nasal drip Neck: No JVD, no thyromegaly, no carotid bruits Lungs: no use of accessory muscles, no dullness to percussion, decreased without rales or rhonchi  Cardiovascular: Rhythm regular, heart sounds  normal, no murmurs or gallops, tr+ peripheral edema Musculoskeletal: No deformities, no cyanosis or clubbing , no tremors       Assessment & Plan:

## 2013-04-01 NOTE — Patient Instructions (Addendum)
Continue on Spiriva daily  Continue on O2 2l/m and 3l/m with activity .  Flu shot today.  Follow back with Dr. Vassie Loll in 4 months and as needed. May use saline nasal spray and saline gel for nasal congestion.

## 2013-04-02 ENCOUNTER — Encounter: Payer: Self-pay | Admitting: General Practice

## 2013-04-02 ENCOUNTER — Ambulatory Visit (INDEPENDENT_AMBULATORY_CARE_PROVIDER_SITE_OTHER): Payer: 59 | Admitting: General Practice

## 2013-04-02 ENCOUNTER — Other Ambulatory Visit: Payer: PRIVATE HEALTH INSURANCE

## 2013-04-02 ENCOUNTER — Other Ambulatory Visit: Payer: Self-pay | Admitting: Physician Assistant

## 2013-04-02 VITALS — BP 111/69 | HR 79 | Temp 97.5°F | Ht <= 58 in | Wt 212.0 lb

## 2013-04-02 DIAGNOSIS — J309 Allergic rhinitis, unspecified: Secondary | ICD-10-CM

## 2013-04-02 DIAGNOSIS — K219 Gastro-esophageal reflux disease without esophagitis: Secondary | ICD-10-CM

## 2013-04-02 DIAGNOSIS — I10 Essential (primary) hypertension: Secondary | ICD-10-CM

## 2013-04-02 DIAGNOSIS — E119 Type 2 diabetes mellitus without complications: Secondary | ICD-10-CM

## 2013-04-02 DIAGNOSIS — E039 Hypothyroidism, unspecified: Secondary | ICD-10-CM

## 2013-04-02 DIAGNOSIS — Z09 Encounter for follow-up examination after completed treatment for conditions other than malignant neoplasm: Secondary | ICD-10-CM

## 2013-04-02 DIAGNOSIS — E785 Hyperlipidemia, unspecified: Secondary | ICD-10-CM

## 2013-04-02 DIAGNOSIS — J441 Chronic obstructive pulmonary disease with (acute) exacerbation: Secondary | ICD-10-CM

## 2013-04-02 DIAGNOSIS — M542 Cervicalgia: Secondary | ICD-10-CM

## 2013-04-02 LAB — POCT CBC
Granulocyte percent: 73.6 %G (ref 37–80)
HCT, POC: 39.9 % (ref 37.7–47.9)
Hemoglobin: 13.1 g/dL (ref 12.2–16.2)
MCV: 87.3 fL (ref 80–97)
MPV: 7.8 fL (ref 0–99.8)
POC Granulocyte: 4.1 (ref 2–6.9)
RDW, POC: 14.8 %

## 2013-04-02 LAB — POCT GLYCOSYLATED HEMOGLOBIN (HGB A1C): Hemoglobin A1C: 7.7

## 2013-04-02 LAB — POCT UA - MICROALBUMIN: Microalbumin Ur, POC: 100 mg/L

## 2013-04-02 MED ORDER — FLUTICASONE PROPIONATE 50 MCG/ACT NA SUSP: 2.0000 | Freq: Every day | NASAL | Status: DC | PRN

## 2013-04-02 MED ORDER — ROSUVASTATIN CALCIUM 10 MG PO TABS: 10.0000 mg | ORAL_TABLET | Freq: Every day | ORAL | Status: DC

## 2013-04-02 MED ORDER — BISOPROLOL FUMARATE 5 MG PO TABS: 5.0000 mg | ORAL_TABLET | Freq: Every day | ORAL | Status: DC

## 2013-04-02 MED ORDER — INSULIN ASPART 100 UNIT/ML ~~LOC~~ SOLN: 8.0000 [IU] | Freq: Three times a day (TID) | SUBCUTANEOUS | Status: DC

## 2013-04-02 MED ORDER — COLESEVELAM HCL 625 MG PO TABS: 625.0000 mg | ORAL_TABLET | Freq: Three times a day (TID) | ORAL | Status: DC

## 2013-04-02 MED ORDER — CANDESARTAN CILEXETIL-HCTZ 16-12.5 MG PO TABS: 1.0000 | ORAL_TABLET | Freq: Every day | ORAL | Status: DC

## 2013-04-02 MED ORDER — PANTOPRAZOLE SODIUM 40 MG PO TBEC: 40.0000 mg | DELAYED_RELEASE_TABLET | Freq: Every day | ORAL | Status: DC

## 2013-04-02 MED ORDER — METFORMIN HCL 1000 MG PO TABS: 1000.0000 mg | ORAL_TABLET | Freq: Two times a day (BID) | ORAL | Status: DC

## 2013-04-02 MED ORDER — INSULIN GLARGINE 100 UNIT/ML ~~LOC~~ SOLN: 35.0000 [IU] | Freq: Every day | SUBCUTANEOUS | Status: DC

## 2013-04-02 MED ORDER — LEVALBUTEROL TARTRATE 45 MCG/ACT IN AERO: 2.0000 | INHALATION_SPRAY | RESPIRATORY_TRACT | Status: DC | PRN

## 2013-04-02 MED ORDER — LEVALBUTEROL HCL 0.63 MG/3ML IN NEBU: 1.0000 | INHALATION_SOLUTION | Freq: Four times a day (QID) | RESPIRATORY_TRACT | Status: DC | PRN

## 2013-04-02 MED ORDER — ALBUTEROL SULFATE (2.5 MG/3ML) 0.083% IN NEBU: 2.5000 mg | INHALATION_SOLUTION | RESPIRATORY_TRACT | Status: DC | PRN

## 2013-04-02 MED ORDER — GABAPENTIN 100 MG PO CAPS: 100.0000 mg | ORAL_CAPSULE | Freq: Three times a day (TID) | ORAL | Status: DC

## 2013-04-02 MED ORDER — TIOTROPIUM BROMIDE MONOHYDRATE 18 MCG IN CAPS: 18.0000 ug | ORAL_CAPSULE | Freq: Every day | RESPIRATORY_TRACT | Status: DC

## 2013-04-02 NOTE — Patient Instructions (Addendum)

## 2013-04-02 NOTE — Progress Notes (Signed)
  Subjective:    Patient ID: [name] [name], female    DOB: [birth year], 62 y.o.   MRN: [id number]  HPI Patient presents today for 3 month chronic health conditions. She has a history of COPD, hypertension, hyperlipidemia, diabetes, hypothyroidism. She reports taking medications as prescribed. Reports checking blood sugars daily and ranges 130's-140's. She reports being seen by a pulmonologist, which she seen yesterday.     Review of Systems  Constitutional: Negative for chills and fatigue.  HENT: Positive for neck pain.        Periodic neck pain  Respiratory: Positive for shortness of breath. Negative for chest tightness.        Oxygen via nasal cannula 3 with activity and 2 at night  Cardiovascular: Negative for chest pain and palpitations.  Gastrointestinal: Negative for nausea, vomiting, abdominal pain, diarrhea, constipation and blood in stool.  Genitourinary: Negative for hematuria and difficulty urinating.  Skin:       Small skin color firm area to left mid chest (size of small pimple)  Neurological: Negative for dizziness, weakness and headaches.       Objective:   Physical Exam  Constitutional: She is oriented to person, place, and time. She appears well-developed and well-nourished.  HENT:  Head: Normocephalic and atraumatic.  Right Ear: External ear normal.  Left Ear: External ear normal.  Eyes: Conjunctivae are normal.  Neck: Normal range of motion. Neck supple. No thyromegaly present.  Cardiovascular: Normal rate, regular rhythm and normal heart sounds.   Pulmonary/Chest: No respiratory distress. She exhibits no tenderness.  Decreased breath sounds throughout  Abdominal: Soft. Bowel sounds are normal. She exhibits no distension. There is no tenderness.  Lymphadenopathy:    She has no cervical adenopathy.  Neurological: She is alert and oriented to person, place, and time.  Skin: Skin is warm and dry.  Left chest firm area, skin colored (1/8 x 1/8 inch), negative  drainage.   Psychiatric: She has a normal mood and affect.          Assessment & Plan:  1. Diabetes - POCT glycosylated hemoglobin (Hb A1C) - POCT UA - Microscopic Only - POCT UA - Microalbumin  2. Unspecified hypothyroidism - Thyroid Panel With TSH  3. Hypertension - CMP14+EGFR  4. Follow-up exam, 3-6 months since previous exam - POCT CBC -Continue all current medications Labs pending F/u in 3 months Discussed exercise and diet  Patient declined dermatology referral or surgical referral at this time for firm nodule left mid center chest Schedule mammogram upon checking out RTO if symptoms worsen or seek emergency medical attention Patient verbalized understanding Coralie Keens, FNP-C

## 2013-04-03 ENCOUNTER — Other Ambulatory Visit: Payer: Self-pay

## 2013-04-03 ENCOUNTER — Other Ambulatory Visit: Payer: Self-pay | Admitting: *Deleted

## 2013-04-03 DIAGNOSIS — I10 Essential (primary) hypertension: Secondary | ICD-10-CM

## 2013-04-03 DIAGNOSIS — J309 Allergic rhinitis, unspecified: Secondary | ICD-10-CM

## 2013-04-03 LAB — CMP14+EGFR
ALT: 28 IU/L (ref 0–32)
Albumin/Globulin Ratio: 2.2 (ref 1.1–2.5)
Albumin: 4.7 g/dL (ref 3.6–4.8)
BUN: 11 mg/dL (ref 8–27)
Calcium: 9.9 mg/dL (ref 8.6–10.2)
Creatinine, Ser: 0.51 mg/dL — ABNORMAL LOW (ref 0.57–1.00)
GFR calc Af Amer: 119 mL/min/{1.73_m2} (ref >59)
GFR calc non Af Amer: 103 mL/min/{1.73_m2} (ref >59)
Globulin, Total: 2.1 g/dL (ref 1.5–4.5)
Glucose: 155 mg/dL — ABNORMAL HIGH (ref 65–99)
Potassium: 4.8 mmol/L (ref 3.5–5.2)
Total Protein: 6.8 g/dL (ref 6.0–8.5)

## 2013-04-03 LAB — NMR, LIPOPROFILE
HDL Particle Number: 34.7 umol/L (ref >=30.5)
LDL Particle Number: 713 nmol/L (ref <1000)
LDLC SERPL CALC-MCNC: 41 mg/dL (ref <100)
LP-IR Score: 49 — ABNORMAL HIGH (ref <=45)

## 2013-04-03 LAB — THYROID PANEL WITH TSH
Free Thyroxine Index: 2 (ref 1.2–4.9)
T3 Uptake Ratio: 25 % (ref 24–39)

## 2013-04-03 LAB — MICROALBUMIN, URINE: Microalbumin, Urine: 1039.8 ug/mL — ABNORMAL HIGH (ref 0.0–17.0)

## 2013-04-03 MED ORDER — AMLODIPINE BESYLATE 10 MG PO TABS: 10.0000 mg | ORAL_TABLET | Freq: Every day | ORAL | Status: DC

## 2013-04-03 MED ORDER — CETIRIZINE HCL 10 MG PO TABS: 10.0000 mg | ORAL_TABLET | ORAL | Status: DC | PRN

## 2013-04-03 MED ORDER — FUROSEMIDE 40 MG PO TABS: 40.0000 mg | ORAL_TABLET | Freq: Every day | ORAL | Status: DC | PRN

## 2013-04-06 NOTE — Assessment & Plan Note (Addendum)
Compensated without flare   Plan  Use lasix As needed for swelling

## 2013-04-06 NOTE — Assessment & Plan Note (Addendum)
Compensated on present regimen   Plan  Continue on Spiriva daily  Continue on O2 2l/m and 3l/m with activity .  Flu shot today.  Follow back with Dr. Vassie Loll in 4 months and as needed. May use saline nasal spray and saline gel for nasal congestion.

## 2013-04-10 ENCOUNTER — Telehealth: Payer: Self-pay | Admitting: General Practice

## 2013-04-13 NOTE — Telephone Encounter (Signed)
Patient aware by Wille Celeste stone

## 2013-04-14 ENCOUNTER — Other Ambulatory Visit: Payer: Self-pay | Admitting: Physician Assistant

## 2013-05-03 ENCOUNTER — Other Ambulatory Visit: Payer: Self-pay | Admitting: Family Medicine

## 2013-06-24 ENCOUNTER — Encounter: Payer: Self-pay | Admitting: General Practice

## 2013-06-24 ENCOUNTER — Ambulatory Visit (INDEPENDENT_AMBULATORY_CARE_PROVIDER_SITE_OTHER): Payer: 59 | Admitting: General Practice

## 2013-06-24 VITALS — BP 169/81 | HR 86 | Temp 97.5°F | Ht 63.0 in | Wt 212.5 lb

## 2013-06-24 DIAGNOSIS — I10 Essential (primary) hypertension: Secondary | ICD-10-CM

## 2013-06-24 DIAGNOSIS — M545 Low back pain: Secondary | ICD-10-CM

## 2013-06-24 DIAGNOSIS — E109 Type 1 diabetes mellitus without complications: Secondary | ICD-10-CM

## 2013-06-24 DIAGNOSIS — M542 Cervicalgia: Secondary | ICD-10-CM

## 2013-06-24 DIAGNOSIS — J441 Chronic obstructive pulmonary disease with (acute) exacerbation: Secondary | ICD-10-CM

## 2013-06-24 DIAGNOSIS — E119 Type 2 diabetes mellitus without complications: Secondary | ICD-10-CM

## 2013-06-24 DIAGNOSIS — K219 Gastro-esophageal reflux disease without esophagitis: Secondary | ICD-10-CM

## 2013-06-24 DIAGNOSIS — E039 Hypothyroidism, unspecified: Secondary | ICD-10-CM

## 2013-06-24 DIAGNOSIS — S39012A Strain of muscle, fascia and tendon of lower back, initial encounter: Secondary | ICD-10-CM

## 2013-06-24 DIAGNOSIS — J309 Allergic rhinitis, unspecified: Secondary | ICD-10-CM

## 2013-06-24 DIAGNOSIS — E785 Hyperlipidemia, unspecified: Secondary | ICD-10-CM

## 2013-06-24 LAB — POCT GLYCOSYLATED HEMOGLOBIN (HGB A1C): Hemoglobin A1C: 7.7

## 2013-06-24 MED ORDER — CYCLOBENZAPRINE HCL 5 MG PO TABS: 5.0000 mg | ORAL_TABLET | Freq: Every day | ORAL | Status: AC

## 2013-06-24 NOTE — Patient Instructions (Signed)

## 2013-06-24 NOTE — Progress Notes (Signed)
Subjective:    Patient ID: [name] [name], female    DOB: [birth year], 62 y.o.   MRN: [id number]  HPI Patient presents today for 3 month follow up. She has a history of COPD, Hypothyroidism, HLD, gerd, DM type 1, neuropathy, and HTN. Report taking mediations as prescribed. Denies regular exercise due to COPD. Reports eating a regular diet. Non compliant with diabetic diet. Reports not understanding at times how to eat healthy.    Review of Systems  Constitutional: Negative for chills and fatigue.  HENT:       Periodic neck pain and right shoulder  Respiratory: Positive for shortness of breath. Negative for chest tightness.        Oxygen dependent  Cardiovascular: Negative for chest pain and palpitations.  Gastrointestinal: Negative for nausea, vomiting, abdominal pain, diarrhea, constipation and blood in stool.  Genitourinary: Negative for hematuria and difficulty urinating.  Musculoskeletal: Positive for back pain and neck pain.       Low back pain  Skin:       Small raised area noted underneath, left breast. Slightly tender  Neurological: Negative for dizziness, weakness and headaches.       Objective:   Physical Exam  Constitutional: She is oriented to person, place, and time. She appears well-developed and well-nourished.  HENT:  Head: Normocephalic and atraumatic.  Right Ear: External ear normal.  Left Ear: External ear normal.  Eyes: Conjunctivae are normal.  Neck: Normal range of motion. Neck supple. No thyromegaly present.  Cardiovascular: Normal rate, regular rhythm and normal heart sounds.   Pulmonary/Chest: No respiratory distress. She exhibits no tenderness.  Decreased breath sounds throughout  Abdominal: Soft. Bowel sounds are normal. She exhibits no distension. There is no tenderness.  Musculoskeletal: She exhibits tenderness.  Lumbar tenderness and tightness noted to lumbar spine area. Negative edema  Lymphadenopathy:    She has no cervical adenopathy.    Neurological: She is alert and oriented to person, place, and time.  Skin: Skin is warm and dry.  Psychiatric: She has a normal mood and affect.          Assessment & Plan:  1. Diabetes mellitus type 2 in nonobese   2. DM (diabetes mellitus), type 1  - POCT glycosylated hemoglobin (Hb A1C)  3. Hypertension  - CMP14+EGFR  4. HLD (hyperlipidemia)  - Lipid panel  5. Hypothyroidism  - Thyroid Panel With TSH  6. Strain of lumbar paraspinal muscle, initial encounter  - cyclobenzaprine (FLEXERIL) 5 MG tablet; Take 1 tablet (5 mg total) by mouth at bedtime.  Dispense: 15 tablet; Refill: 0 -sedation precaution discussed   7. COPD exacerbation  - tiotropium (SPIRIVA) 18 MCG inhalation capsule; Place 1 capsule (18 mcg total) into inhaler and inhale daily.  Dispense: 30 capsule; Refill: 5 - levalbuterol (XOPENEX) 0.63 MG/3ML nebulizer solution; Take 3 mLs (0.63 mg total) by nebulization every 6 (six) hours as needed.  Dispense: 3 mL; Refill: 5 - albuterol (PROVENTIL) (2.5 MG/3ML) 0.083% nebulizer solution; Take 3 mLs (2.5 mg total) by nebulization every 4 (four) hours as needed.  Dispense: 30 vial; Refill: 4  8. Other and unspecified hyperlipidemia  - rosuvastatin (CRESTOR) 10 MG tablet; Take 1 tablet (10 mg total) by mouth daily.  Dispense: 30 tablet; Refill: 5 - colesevelam (WELCHOL) 625 MG tablet; Take 1 tablet (625 mg total) by mouth 3 (three) times daily.  Dispense: 90 tablet; Refill: 4  9. GERD (gastroesophageal reflux disease)  - pantoprazole (PROTONIX) 40 MG tablet; Take 1  tablet (40 mg total) by mouth daily.  Dispense: 30 tablet; Refill: 5  10. Diabetes  - metFORMIN (GLUCOPHAGE) 1000 MG tablet; Take 1 tablet (1,000 mg total) by mouth 2 (two) times daily with a meal.  Dispense: 60 tablet; Refill: 5 - insulin glargine (LANTUS) 100 UNIT/ML injection; Inject 0.35 mLs (35 Units total) into the skin at bedtime. Prn  Dispense: 10 mL; Refill: 5 - insulin aspart (NOVOLOG)  100 UNIT/ML injection; Inject 8 Units into the skin 3 (three) times daily before meals.  Dispense: 1 vial; Refill: 5 - colesevelam (WELCHOL) 625 MG tablet; Take 1 tablet (625 mg total) by mouth 3 (three) times daily.  Dispense: 90 tablet; Refill: 4 - candesartan-hydrochlorothiazide (ATACAND HCT) 16-12.5 MG per tablet; Take 1 tablet by mouth daily.  Dispense: 30 tablet; Refill: 4  11. Cervicalgia  - gabapentin (NEURONTIN) 100 MG capsule; Take 1 capsule (100 mg total) by mouth 3 (three) times daily.  Dispense: 90 capsule; Refill: 5  12. HTN (hypertension)  - furosemide (LASIX) 40 MG tablet; Take 1 tablet (40 mg total) by mouth daily as needed.  Dispense: 30 tablet; Refill: 4 - candesartan-hydrochlorothiazide (ATACAND HCT) 16-12.5 MG per tablet; Take 1 tablet by mouth daily.  Dispense: 30 tablet; Refill: 4 - bisoprolol (ZEBETA) 5 MG tablet; Take 1 tablet (5 mg total) by mouth daily.  Dispense: 30 tablet; Refill: 4 - amLODipine (NORVASC) 10 MG tablet; Take 1 tablet (10 mg total) by mouth daily.  Dispense: 30 tablet; Refill: 4  13. Allergic rhinitis  - fluticasone (FLONASE) 50 MCG/ACT nasal spray; 2 sprays daily as needed for rhinitis or allergies.  Dispense: 16 g; Refill: 4 - cetirizine (ZYRTEC) 10 MG tablet; Take 1 tablet (10 mg total) by mouth as needed.  Dispense: 30 tablet; Refill: 11 -Continue all current medications Labs pending F/u in 3 months Discussed benefits of regular exercise and healthy eating Patient verbalized understanding Coralie Keens, FNP-C

## 2013-06-25 LAB — THYROID PANEL WITH TSH
Free Thyroxine Index: 2.3 (ref 1.2–4.9)
TSH: 3.78 u[IU]/mL (ref 0.450–4.500)

## 2013-06-25 LAB — LIPID PANEL
HDL: 45 mg/dL (ref >39)
LDL Calculated: 56 mg/dL (ref 0–99)
Triglycerides: 114 mg/dL (ref 0–149)
VLDL Cholesterol Cal: 23 mg/dL (ref 5–40)

## 2013-06-25 LAB — CMP14+EGFR
ALT: 20 IU/L (ref 0–32)
AST: 16 IU/L (ref 0–40)
CO2: 31 mmol/L — ABNORMAL HIGH (ref 18–29)
Calcium: 9.9 mg/dL (ref 8.6–10.2)
Chloride: 92 mmol/L — ABNORMAL LOW (ref 97–108)
Globulin, Total: 2.4 g/dL (ref 1.5–4.5)
Glucose: 188 mg/dL — ABNORMAL HIGH (ref 65–99)
Potassium: 4 mmol/L (ref 3.5–5.2)
Sodium: 140 mmol/L (ref 134–144)
Total Protein: 7 g/dL (ref 6.0–8.5)

## 2013-06-25 MED ORDER — BISOPROLOL FUMARATE 5 MG PO TABS: 5.0000 mg | ORAL_TABLET | Freq: Every day | ORAL | Status: AC

## 2013-06-25 MED ORDER — FLUTICASONE PROPIONATE 50 MCG/ACT NA SUSP: 2.0000 | Freq: Every day | NASAL | Status: AC | PRN

## 2013-06-25 MED ORDER — CANDESARTAN CILEXETIL-HCTZ 16-12.5 MG PO TABS: 1.0000 | ORAL_TABLET | Freq: Every day | ORAL | Status: AC

## 2013-06-25 MED ORDER — CETIRIZINE HCL 10 MG PO TABS: 10.0000 mg | ORAL_TABLET | ORAL | Status: AC | PRN

## 2013-06-25 MED ORDER — INSULIN GLARGINE 100 UNIT/ML ~~LOC~~ SOLN: 35.0000 [IU] | Freq: Every day | SUBCUTANEOUS | Status: AC

## 2013-06-25 MED ORDER — LEVALBUTEROL HCL 0.63 MG/3ML IN NEBU: 0.6300 mg | INHALATION_SOLUTION | Freq: Four times a day (QID) | RESPIRATORY_TRACT | Status: AC | PRN

## 2013-06-25 MED ORDER — PANTOPRAZOLE SODIUM 40 MG PO TBEC: 40.0000 mg | DELAYED_RELEASE_TABLET | Freq: Every day | ORAL | Status: AC

## 2013-06-25 MED ORDER — ROSUVASTATIN CALCIUM 10 MG PO TABS: 10.0000 mg | ORAL_TABLET | Freq: Every day | ORAL | Status: AC

## 2013-06-25 MED ORDER — AMLODIPINE BESYLATE 10 MG PO TABS: 10.0000 mg | ORAL_TABLET | Freq: Every day | ORAL | Status: AC

## 2013-06-25 MED ORDER — INSULIN ASPART 100 UNIT/ML ~~LOC~~ SOLN: 8.0000 [IU] | Freq: Three times a day (TID) | SUBCUTANEOUS | Status: AC

## 2013-06-25 MED ORDER — TIOTROPIUM BROMIDE MONOHYDRATE 18 MCG IN CAPS: 18.0000 ug | ORAL_CAPSULE | Freq: Every day | RESPIRATORY_TRACT | Status: AC

## 2013-06-25 MED ORDER — FUROSEMIDE 40 MG PO TABS: 40.0000 mg | ORAL_TABLET | Freq: Every day | ORAL | Status: AC | PRN

## 2013-06-25 MED ORDER — LEVALBUTEROL TARTRATE 45 MCG/ACT IN AERO: 2.0000 | INHALATION_SPRAY | RESPIRATORY_TRACT | Status: AC | PRN

## 2013-06-25 MED ORDER — METFORMIN HCL 1000 MG PO TABS: 1000.0000 mg | ORAL_TABLET | Freq: Two times a day (BID) | ORAL | Status: AC

## 2013-06-25 MED ORDER — ALBUTEROL SULFATE (2.5 MG/3ML) 0.083% IN NEBU: 2.5000 mg | INHALATION_SOLUTION | RESPIRATORY_TRACT | Status: AC | PRN

## 2013-06-25 MED ORDER — COLESEVELAM HCL 625 MG PO TABS: 625.0000 mg | ORAL_TABLET | Freq: Three times a day (TID) | ORAL | Status: AC

## 2013-06-25 MED ORDER — GABAPENTIN 100 MG PO CAPS: 100.0000 mg | ORAL_CAPSULE | Freq: Three times a day (TID) | ORAL | Status: AC

## 2013-07-07 ENCOUNTER — Ambulatory Visit: Payer: 59 | Admitting: General Practice

## 2013-07-15 ENCOUNTER — Emergency Department (HOSPITAL_COMMUNITY): Payer: 59

## 2013-07-15 ENCOUNTER — Emergency Department (HOSPITAL_COMMUNITY)
Admission: EM | Admit: 2013-07-15 | Discharge: 2013-08-09 | Disposition: E | Payer: 59 | Attending: Emergency Medicine | Admitting: Emergency Medicine

## 2013-07-15 DIAGNOSIS — J449 Chronic obstructive pulmonary disease, unspecified: Secondary | ICD-10-CM | POA: Insufficient documentation

## 2013-07-15 DIAGNOSIS — K219 Gastro-esophageal reflux disease without esophagitis: Secondary | ICD-10-CM | POA: Insufficient documentation

## 2013-07-15 DIAGNOSIS — I469 Cardiac arrest, cause unspecified: Secondary | ICD-10-CM

## 2013-07-15 DIAGNOSIS — G4733 Obstructive sleep apnea (adult) (pediatric): Secondary | ICD-10-CM | POA: Insufficient documentation

## 2013-07-15 DIAGNOSIS — J4489 Other specified chronic obstructive pulmonary disease: Secondary | ICD-10-CM | POA: Insufficient documentation

## 2013-07-15 DIAGNOSIS — Z79899 Other long term (current) drug therapy: Secondary | ICD-10-CM | POA: Insufficient documentation

## 2013-07-15 DIAGNOSIS — IMO0002 Reserved for concepts with insufficient information to code with codable children: Secondary | ICD-10-CM | POA: Insufficient documentation

## 2013-07-15 DIAGNOSIS — E119 Type 2 diabetes mellitus without complications: Secondary | ICD-10-CM | POA: Insufficient documentation

## 2013-07-15 DIAGNOSIS — E785 Hyperlipidemia, unspecified: Secondary | ICD-10-CM | POA: Insufficient documentation

## 2013-07-15 DIAGNOSIS — Z7982 Long term (current) use of aspirin: Secondary | ICD-10-CM | POA: Insufficient documentation

## 2013-07-15 DIAGNOSIS — F411 Generalized anxiety disorder: Secondary | ICD-10-CM | POA: Insufficient documentation

## 2013-07-15 DIAGNOSIS — Z87891 Personal history of nicotine dependence: Secondary | ICD-10-CM | POA: Insufficient documentation

## 2013-07-15 DIAGNOSIS — F3289 Other specified depressive episodes: Secondary | ICD-10-CM | POA: Insufficient documentation

## 2013-07-15 DIAGNOSIS — F329 Major depressive disorder, single episode, unspecified: Secondary | ICD-10-CM | POA: Insufficient documentation

## 2013-07-15 DIAGNOSIS — E039 Hypothyroidism, unspecified: Secondary | ICD-10-CM | POA: Insufficient documentation

## 2013-07-15 DIAGNOSIS — Z794 Long term (current) use of insulin: Secondary | ICD-10-CM | POA: Insufficient documentation

## 2013-07-15 DIAGNOSIS — R404 Transient alteration of awareness: Secondary | ICD-10-CM | POA: Insufficient documentation

## 2013-07-15 DIAGNOSIS — I10 Essential (primary) hypertension: Secondary | ICD-10-CM | POA: Insufficient documentation

## 2013-07-15 LAB — POCT I-STAT, CHEM 8
BUN: 24 mg/dL — AB (ref 6–23)
CREATININE: 1.3 mg/dL — AB (ref 0.50–1.10)
Calcium, Ion: 1.14 mmol/L (ref 1.13–1.30)
Chloride: 95 mEq/L — ABNORMAL LOW (ref 96–112)
Glucose, Bld: 250 mg/dL — ABNORMAL HIGH (ref 70–99)
HCT: 37 % (ref 36.0–46.0)
HEMOGLOBIN: 12.6 g/dL (ref 12.0–15.0)
POTASSIUM: 4.6 meq/L (ref 3.7–5.3)
Sodium: 139 mEq/L (ref 137–147)
TCO2: 26 mmol/L (ref 0–100)

## 2013-07-15 LAB — CBC WITH DIFFERENTIAL/PLATELET
Basophils Absolute: 0.1 10*3/uL (ref 0.0–0.1)
Basophils Relative: 1 % (ref 0–1)
Eosinophils Absolute: 0 10*3/uL (ref 0.0–0.7)
Eosinophils Relative: 0 % (ref 0–5)
HCT: 40.2 % (ref 36.0–46.0)
Hemoglobin: 12 g/dL (ref 12.0–15.0)
Lymphocytes Relative: 23 % (ref 12–46)
Lymphs Abs: 2.9 10*3/uL (ref 0.7–4.0)
MCH: 29.1 pg (ref 26.0–34.0)
MCHC: 29.9 g/dL — ABNORMAL LOW (ref 30.0–36.0)
MCV: 97.3 fL (ref 78.0–100.0)
MONO ABS: 1.4 10*3/uL — AB (ref 0.1–1.0)
MONOS PCT: 11 % (ref 3–12)
NEUTROS ABS: 8.4 10*3/uL — AB (ref 1.7–7.7)
Neutrophils Relative %: 65 % (ref 43–77)
Platelets: 162 10*3/uL (ref 150–400)
RBC: 4.13 MIL/uL (ref 3.87–5.11)
RDW: 14.1 % (ref 11.5–15.5)
WBC: 12.8 10*3/uL — ABNORMAL HIGH (ref 4.0–10.5)

## 2013-07-15 LAB — URINALYSIS, ROUTINE W REFLEX MICROSCOPIC
Glucose, UA: 100 mg/dL — AB
KETONES UR: NEGATIVE mg/dL
LEUKOCYTES UA: NEGATIVE
NITRITE: NEGATIVE
Specific Gravity, Urine: 1.025 (ref 1.005–1.030)
Urobilinogen, UA: 1 mg/dL (ref 0.0–1.0)
pH: 5.5 (ref 5.0–8.0)

## 2013-07-15 LAB — COMPREHENSIVE METABOLIC PANEL
ALK PHOS: 155 U/L — AB (ref 39–117)
ALT: 118 U/L — ABNORMAL HIGH (ref 0–35)
AST: 199 U/L — ABNORMAL HIGH (ref 0–37)
Albumin: 2.7 g/dL — ABNORMAL LOW (ref 3.5–5.2)
BILIRUBIN TOTAL: 0.9 mg/dL (ref 0.3–1.2)
BUN: 19 mg/dL (ref 6–23)
CHLORIDE: 90 meq/L — AB (ref 96–112)
CO2: 20 mEq/L (ref 19–32)
CREATININE: 0.92 mg/dL (ref 0.50–1.10)
Calcium: 9.8 mg/dL (ref 8.4–10.5)
GFR calc Af Amer: 76 mL/min — ABNORMAL LOW (ref >90)
GFR calc non Af Amer: 65 mL/min — ABNORMAL LOW (ref >90)
GLUCOSE: 260 mg/dL — AB (ref 70–99)
POTASSIUM: 5.1 meq/L (ref 3.7–5.3)
Sodium: 147 mEq/L (ref 137–147)
Total Protein: 7 g/dL (ref 6.0–8.3)

## 2013-07-15 LAB — POCT I-STAT TROPONIN I: TROPONIN I, POC: 0.15 ng/mL — AB (ref 0.00–0.08)

## 2013-07-15 LAB — URINE MICROSCOPIC-ADD ON

## 2013-07-15 LAB — CG4 I-STAT (LACTIC ACID): Lactic Acid, Venous: 17.78 mmol/L — ABNORMAL HIGH (ref 0.5–2.2)

## 2013-07-15 MED ORDER — PIPERACILLIN-TAZOBACTAM 3.375 G IVPB 30 MIN: 3.3750 g | Freq: Once | INTRAVENOUS | Status: DC

## 2013-07-15 MED ORDER — DOPAMINE-DEXTROSE 3.2-5 MG/ML-% IV SOLN
INTRAVENOUS | Status: AC
  Filled 2013-07-15: qty 250

## 2013-07-15 MED ORDER — EPINEPHRINE HCL 0.1 MG/ML IJ SOSY
PREFILLED_SYRINGE | INTRAMUSCULAR | Status: AC | PRN
  Administered 2013-07-15: 1 mg via INTRAVENOUS

## 2013-07-15 MED ORDER — SODIUM CHLORIDE 0.9 % IV BOLUS (SEPSIS)
1000.0000 mL | Freq: Once | INTRAVENOUS | Status: AC
  Administered 2013-07-15: 1000 mL via INTRAVENOUS

## 2013-07-15 MED ORDER — DEXTROSE 5 % IV SOLN
0.5000 ug/min | INTRAVENOUS | Status: DC
  Filled 2013-07-15: qty 1

## 2013-07-15 MED ORDER — EPINEPHRINE HCL 0.1 MG/ML IJ SOSY
PREFILLED_SYRINGE | INTRAMUSCULAR | Status: AC | PRN
  Administered 2013-07-15: 1 via INTRAVENOUS

## 2013-07-15 MED ORDER — NOREPINEPHRINE BITARTRATE 1 MG/ML IJ SOLN
2.0000 ug/min | Freq: Once | INTRAVENOUS | Status: AC
  Administered 2013-07-15: 5 ug/min via INTRAVENOUS
  Filled 2013-07-15: qty 4

## 2013-07-16 MED FILL — Medication: Qty: 1 | Status: AC

## 2013-07-17 LAB — URINE CULTURE
Colony Count: NO GROWTH
Culture: NO GROWTH

## 2013-08-09 NOTE — ED Notes (Signed)
Patient presents via [facility], patient found unresponsive by family around 1500, called EMS, first responders began CPR at 1605, patient was initially pulseless and apneic. EMS reports patient regained pulses several times en route, currently CPR in progress, EMS placed [facility] airway, IO to right tibia in place.

## 2013-08-09 NOTE — Consult Note (Signed)
Name: [name] [name] MRN: [id number] DOB: 1950/12/29    ADMISSION DATE:  07/27/2013 CONSULTATION DATE:  07/09/2013  REFERRING MD :  Regenia Skeeter   CHIEF COMPLAINT:  Found down  BRIEF PATIENT DESCRIPTION: [name] [name] is a 63 year old female who was found down by EMS, she has had multiple ACLS resuscitation episodes on route to [facility] and successful 2 CPR episodes at [facility] for a total duration well over 1 hour.  SIGNIFICANT EVENTS / STUDIES:  08/05/2013 Multiple episodes of cardiac arrest  LINES / TUBES: 08/07/2013 RLE IO placed by EMS>>> removed 07/14/2013 LUE PIV 1//7/15 RUE PIV 07/17/2013 ETT   CULTURES: None  ANTIBIOTICS: None  HISTORY OF PRESENT ILLNESS:  [name] [name] is a 63 year old female with a PMH of HTN, COPD, Cor Pulmonale, DM. When her husband found her at home this evening she was in respiratory distress and subsequently collapsed.  When EMS arrived she was asystole and CPR was intubated.  She had ROSC multiple times in route to ED yet continued to become bradycardic and developed PEA/Asystole. She was coded twice in the [facility].  Per husband she has had a cold in recent days and was diagnosed with the flu yesterday.  PAST MEDICAL HISTORY :  No past medical history on file. No past surgical history on file. Prior to Admission medications   Not on File   Allergies not on file  FAMILY HISTORY:  No family history on file. SOCIAL HISTORY:  has no tobacco, alcohol, and drug history on file.  REVIEW OF SYSTEMS:  Unable to obtain, patient unresponsive.  SUBJECTIVE: Unable to obtain  VITAL SIGNS: Temp:  [96 F (35.6 C)] 96 F (35.6 C) (01/07 1940) Pulse Rate:  [36-136] 53 (01/07 2005) Resp:  [0-30] 10 (01/07 2001) BP: (67-157)/(28-98) 83/32 mmHg (01/07 2001) SpO2:  [89 %-100 %] 92 % (01/07 2001) Weight:  [250 lb (113.399 kg)] 250 lb (113.399 kg) (01/07 2029) HEMODYNAMICS:   VENTILATOR SETTINGS:   INTAKE / OUTPUT: Intake/Output   None     PHYSICAL  EXAMINATION: General:  Intubated on ventilator, unresponsive Neuro:  Pupils fixed and dilated Cardiovascular:  RRR no murmur Lungs:  B/L course breath sounds Abdomen:  Obese, soft, non-tender, +BS Musculoskeletal:  Trace edema Skin:  Cool to touch  LABS:  CBC  Recent Labs Lab 07/16/2013 1942 07/13/2013 2021  WBC 12.8*  --   HGB 12.0 12.6  HCT 40.2 37.0  PLT 162  --    BMET  Recent Labs Lab 07/14/2013 2021  NA 139  K 4.6  CL 95*  BUN 24*  CREATININE 1.30*  GLUCOSE 250*   Sepsis Markers  Recent Labs Lab 07/22/2013 2022  LATICACIDVEN 17.78*    Imaging Dg Chest Portable 1 View  08/06/2013   CLINICAL DATA:  Intubation  EXAM: PORTABLE CHEST - 1 VIEW  COMPARISON:  Portable exam 1940 hr without priors for comparison  FINDINGS: Tip of endotracheal tube projects 4.7 cm above carinal.  Nasogastric tube extends into stomach.  EKG leads and external pacing leads present.  Upper normal size of cardiac silhouette.  Diffuse interstitial infiltrates greater on right, question edema or less likely infection.  No gross pleural effusion or pneumothorax.  IMPRESSION: Tube positions as above.  Bilateral infiltrates greater on right, favor edema over infection.   Electronically Signed   By: Lavonia Dana M.D.   On: 07/13/2013 19:55    ASSESSMENT / PLAN:  PULMONARY A:ARDS, Likely flu , r/o super  infection PNA P:   - Continue vent, patient's husband does not want escalation of care -peep 10  CARDIOVASCULAR A: S/P Cardiac Resusitation, extensive, cardiogenic shock, r/o septic P:  - Very poor prognosis, husband wishes DNR if patient becomes pulseless again. -levophed is maxed at 20 mics -brady now, add dop  NEUROLOGIC A:  Anoxic brain injury- likley will progress brain death P:   - Comfort care per family -poor recovery  Family decided for comfort care, no escalation of care and DNR.  Patient subsequently became bradycardic and again lost pulses.  Rhythm was asystole.  Ventilator was  discontinued and patient was apneic.  Pupils fixed and dilated. TOD called 20:26.  I have personally obtained a history, examined the patient, evaluated laboratory and imaging results, formulated the assessment and plan and placed orders. CRITICAL CARE: The patient is critically ill with multiple organ systems failure and requires high complexity decision making for assessment and support, frequent evaluation and titration of therapies, application of advanced monitoring technologies and extensive interpretation of multiple databases. Critical Care Time devoted to patient care services described in this note is 35 minutes.   Lavon Paganini. Titus Mould, MD, FACP Pgr: [place] DO PGY-1 Internal Medicine 07/19/2013, 8:51 PM

## 2013-08-09 NOTE — ED Notes (Signed)
Patient started to code again, CPR resumed for 30 seconds, Dr. Wyvonna Plum currently talking with family who made patient DNR, Dr. Jamse Arn asked to stop compressions. Compressions stopped at 2025.

## 2013-08-09 NOTE — ED Provider Notes (Signed)
CSN: [id number]     Arrival date & time 2013-08-05  1930 History   First MD Initiated Contact with Patient Aug 05, 2013 1939     Chief Complaint  Patient presents with  . Cardiac Arrest   HPI  Presents via EMS after cardiac arrest. Per family, the patient has had a "chest cold" for the last several days. Today the patient's husband left to go to the store. Upon returning the patient got up and out of a chair and then collapsed. EMS was called. When they arrived she was found to be asystolic. ACLS was started and she received several doses of epi in route. CPR began at approximately 16:05. EMS states last normal time approximately 10 minutes prior to that. ROSC was obtained several times and lost during route. King airway was placed. CPR was in progress on arrival to [facility]. Level 5 caveat secondary to mental state.   Past Medical History  Diagnosis Date  . COPD (chronic obstructive pulmonary disease)   . Morbid obesity   . Papillary fibroelastoma of heart   . Hypertension   . OSA (obstructive sleep apnea)   . Hypothyroidism   . Cor pulmonale   . Asthma   . Right shoulder pain   . GERD (gastroesophageal reflux disease)   . Anxiety   . Depression   . Hyperlipidemia   . Diabetes mellitus without complication    Past Surgical History  Procedure Laterality Date  . Knee arthroscopy    . Vesicovaginal fistula closure w/ tah     Family History  Problem Relation Age of Onset  . Emphysema Brother   . Emphysema Father   . Heart disease Father   . Emphysema Mother    History  Substance Use Topics  . Smoking status: Former Smoker -- 2.50 packs/day for 35 years    Types: Cigarettes    Quit date: 03/09/2008  . Smokeless tobacco: Never Used  . Alcohol Use: 0.0 oz/week     Comment: rare   OB History   Grav Para Term Preterm Abortions TAB SAB Ect Mult Living                 Review of Systems  Unable to perform ROS: Acuity of condition   Allergies  Cephalexin  Home Medications    Current Outpatient Rx  Name  Route  Sig  Dispense  Refill  . albuterol (PROVENTIL) (2.5 MG/3ML) 0.083% nebulizer solution   Nebulization   Take 3 mLs (2.5 mg total) by nebulization every 4 (four) hours as needed.   30 vial   4   . amLODipine (NORVASC) 10 MG tablet   Oral   Take 1 tablet (10 mg total) by mouth daily.   30 tablet   4   . aspirin 81 MG tablet      Take 2-3 times a week         . bisoprolol (ZEBETA) 5 MG tablet   Oral   Take 1 tablet (5 mg total) by mouth daily.   30 tablet   4   . candesartan-hydrochlorothiazide (ATACAND HCT) 16-12.5 MG per tablet   Oral   Take 1 tablet by mouth daily.   30 tablet   4   . cetirizine (ZYRTEC) 10 MG tablet   Oral   Take 1 tablet (10 mg total) by mouth as needed.   30 tablet   11   . cholecalciferol (VITAMIN D) 1000 UNITS tablet   Oral   Take 1,000 Units  by mouth daily.         . colesevelam (WELCHOL) 625 MG tablet   Oral   Take 1 tablet (625 mg total) by mouth 3 (three) times daily.   90 tablet   4   . cyclobenzaprine (FLEXERIL) 5 MG tablet   Oral   Take 1 tablet (5 mg total) by mouth at bedtime.   15 tablet   0   . fluticasone (FLONASE) 50 MCG/ACT nasal spray   Each Nare   Place 2 sprays into both nostrils daily as needed for rhinitis or allergies.   16 g   4   . furosemide (LASIX) 40 MG tablet   Oral   Take 1 tablet (40 mg total) by mouth daily as needed.   30 tablet   4   . gabapentin (NEURONTIN) 100 MG capsule   Oral   Take 1 capsule (100 mg total) by mouth 3 (three) times daily.   90 capsule   5   . ibuprofen (ADVIL,MOTRIN) 200 MG tablet   Oral   Take 200 mg by mouth every 6 (six) hours as needed.           . insulin aspart (NOVOLOG) 100 UNIT/ML injection   Subcutaneous   Inject 8 Units into the skin 3 (three) times daily before meals.   1 vial   5   . insulin glargine (LANTUS) 100 UNIT/ML injection   Subcutaneous   Inject 0.35 mLs (35 Units total) into the skin at bedtime.  Prn   10 mL   5   . levalbuterol (XOPENEX HFA) 45 MCG/ACT inhaler   Inhalation   Inhale 2 puffs into the lungs every 4 (four) hours as needed.   1 Inhaler   2   . levalbuterol (XOPENEX) 0.63 MG/3ML nebulizer solution   Nebulization   Take 3 mLs (0.63 mg total) by nebulization every 6 (six) hours as needed.   3 mL   5   . metFORMIN (GLUCOPHAGE) 1000 MG tablet   Oral   Take 1 tablet (1,000 mg total) by mouth 2 (two) times daily with a meal.   60 tablet   5   . pantoprazole (PROTONIX) 40 MG tablet   Oral   Take 1 tablet (40 mg total) by mouth daily.   30 tablet   5   . rosuvastatin (CRESTOR) 10 MG tablet   Oral   Take 1 tablet (10 mg total) by mouth daily.   30 tablet   5   . SYNTHROID 100 MCG tablet      TAKE ONE TABLET BY MOUTH ONE TIME DAILY   30 tablet   9     Dispense as written.   . tiotropium (SPIRIVA) 18 MCG inhalation capsule   Inhalation   Place 1 capsule (18 mcg total) into inhaler and inhale daily.   30 capsule   5    BP 83/32  Pulse 53  Temp(Src) 96 F (35.6 C) (Rectal)  Resp 10  Wt 250 lb (113.399 kg)  SpO2 92% Physical Exam  Nursing note and vitals reviewed. Constitutional:  obese  HENT:  Head: Normocephalic and atraumatic.  Eyes:  6 mm and sluggish bilaterally. Symmetric  Neck: Normal range of motion. Neck supple.  Cardiovascular:  Pulseless on arrival  Pulmonary/Chest:  King airway in place. Bilateral breath sounds.  Abdominal: Soft. She exhibits no distension. There is no tenderness.  Musculoskeletal: Normal range of motion.  Extremities are grossly atraumatic  Neurological: She is unresponsive.  GCS eye subscore is 1. GCS verbal subscore is 1. GCS motor subscore is 1.    ED Course  INTUBATION Date/Time: 07/16/2013 11:00 AM Performed by: Donita Brooks Authorized by: Donita Brooks Consent: The procedure was performed in an emergent situation. Indications: respiratory failure Patient status:  unconscious Preoxygenation: [place] Airway. Laryngoscope size: Mac 4 Tube size: 7.5 mm Tube type: cuffed Number of attempts: 1 Post-procedure assessment: chest rise and ETCO2 monitor Breath sounds: equal and absent over the epigastrium Cuff inflated: yes ETT to lip: 23 cm ETT to teeth: 22 cm Tube secured with: ETT holder Chest x-ray interpreted by me. Chest x-ray findings: endotracheal tube in appropriate position Patient tolerance: Patient tolerated the procedure well with no immediate complications.   (including critical care time) Labs Review Labs Reviewed  CBC WITH DIFFERENTIAL - Abnormal; Notable for the following:    WBC 12.8 (*)    MCHC 29.9 (*)    Neutro Abs 8.4 (*)    Monocytes Absolute 1.4 (*)    All other components within normal limits  COMPREHENSIVE METABOLIC PANEL - Abnormal; Notable for the following:    Chloride 90 (*)    Glucose, Bld 260 (*)    Albumin 2.7 (*)    AST 199 (*)    ALT 118 (*)    Alkaline Phosphatase 155 (*)    GFR calc non Af Amer 65 (*)    GFR calc Af Amer 76 (*)    All other components within normal limits  URINALYSIS, ROUTINE W REFLEX MICROSCOPIC - Abnormal; Notable for the following:    Color, Urine AMBER (*)    APPearance CLOUDY (*)    Glucose, UA 100 (*)    Hgb urine dipstick MODERATE (*)    Bilirubin Urine SMALL (*)    Protein, ur >300 (*)    All other components within normal limits  URINE MICROSCOPIC-ADD ON - Abnormal; Notable for the following:    Bacteria, UA FEW (*)    Casts HYALINE CASTS (*)    All other components within normal limits  POCT I-STAT, CHEM 8 - Abnormal; Notable for the following:    Chloride 95 (*)    BUN 24 (*)    Creatinine, Ser 1.30 (*)    Glucose, Bld 250 (*)    All other components within normal limits  POCT I-STAT TROPONIN I - Abnormal; Notable for the following:    Troponin i, poc 0.15 (*)    All other components within normal limits  CG4 I-STAT (LACTIC ACID) - Abnormal; Notable for the following:     Lactic Acid, Venous 17.78 (*)    All other components within normal limits  URINE CULTURE  PATHOLOGIST SMEAR REVIEW   Imaging Review Dg Chest Portable 1 View  07/19/2013   CLINICAL DATA:  Intubation  EXAM: PORTABLE CHEST - 1 VIEW  COMPARISON:  Portable exam 1940 hr without priors for comparison  FINDINGS: Tip of endotracheal tube projects 4.7 cm above carinal.  Nasogastric tube extends into stomach.  EKG leads and external pacing leads present.  Upper normal size of cardiac silhouette.  Diffuse interstitial infiltrates greater on right, question edema or less likely infection.  No gross pleural effusion or pneumothorax.  IMPRESSION: Tube positions as above.  Bilateral infiltrates greater on right, favor edema over infection.   Electronically Signed   By: Lavonia Dana M.D.   On: 07/10/2013 19:55    EKG Interpretation    Date/Time:  Wednesday July 15 2013 19:35:20 EST Ventricular Rate:  130 PR Interval:    QRS Duration: 85 QT Interval:  414 QTC Calculation: 609 R Axis:   76 Text Interpretation:  Age not entered, assumed to be  63 years old for purpose of ECG interpretation Atrial fibrillation Ventricular premature complex Nonspecific T abnormalities, lateral leads Prolonged QT interval No old tracing to compare Confirmed by GOLDSTON  MD, SCOTT ([id number]) on 08-13-13 9:08:55 PM            MDM   1. Cardiac arrest    Presents in cardiac arrest in setting of reported respiratory infection. In PEA on arrival. CPR was being performed. ACLS continued on arrival. Muse airway replaced with definitive airway. Obtained ROSC. Aggressively rehydrated with NS and started on Levophed as the patient was believed to be in septic shock given reported history of recent respiratory infection per EMS. The patient lost pulses multiple times while in the ED only to obtain ROSC after initiating ACLS. PCCM was consulted and came to the bedside and evaluated the patient. Dr. Titus Mould spoke with the patient's  family who decided to make the patient comfort care. No escalation of care and DNR. The patient became bradycardic and then once again lost pulses. CPR was not pursued per the families wishes. The patient became asystolic, remained pulseless and apneic. No lung or heart sounds. Pupils fixed and dilated. TOD called at 20:26.  Donita Brooks, MD 07/16/13 1104

## 2013-08-09 NOTE — ED Notes (Signed)
Family here, taken to consultation room B, EDP made aware

## 2013-08-09 NOTE — ED Provider Notes (Signed)
I saw and evaluated the patient, reviewed the resident's note and I agree with the findings and plan.  EKG Interpretation    Date/Time:  Wednesday July 15 2013 19:35:20 EST Ventricular Rate:  130 PR Interval:    QRS Duration: 85 QT Interval:  414 QTC Calculation: 609 R Axis:   76 Text Interpretation:  Age not entered, assumed to be  63 years old for purpose of ECG interpretation Atrial fibrillation Ventricular premature complex Nonspecific T abnormalities, lateral leads Prolonged QT interval No old tracing to compare Confirmed by Danahi Reddish  MD, Roald Lukacs ([id number]) on 07/12/2013 9:08:55 PM           I was present for and directly supervised the intubation performed by Dr. Jamse Arn.   Cardiopulmonary Resuscitation (CPR) Procedure Note Directed/Performed by: Ephraim Hamburger I personally directed ancillary staff and/or performed CPR in an effort to regain return of spontaneous circulation and to maintain cardiac, neuro and systemic perfusion.     Patient critically ill on arrival, with intermittent CPR and ROSC for over 1 hour prior to arrival. CPR performed multiple times in ED with intermittent ROSC. Likely septic as cause of arrest. Patient made DNR and died in ER  Ephraim Hamburger, MD 07/16/13 2039

## 2013-08-09 NOTE — Progress Notes (Signed)
Patient placed on ventilator after arrival. Patient expired shortly after.

## 2013-08-09 DEATH — deceased

## 2013-09-06 DEATH — deceased

## 2013-09-22 ENCOUNTER — Ambulatory Visit: Payer: 59 | Admitting: General Practice

## 2015-08-29 IMAGING — DX DG CHEST 1V PORT
1 series · 1 of 1 positions shown · non-contrast
Comparison: Portable exam 9800 hr without priors for comparison

CLINICAL DATA: Intubation

EXAM:
PORTABLE CHEST - 1 VIEW

[portable]
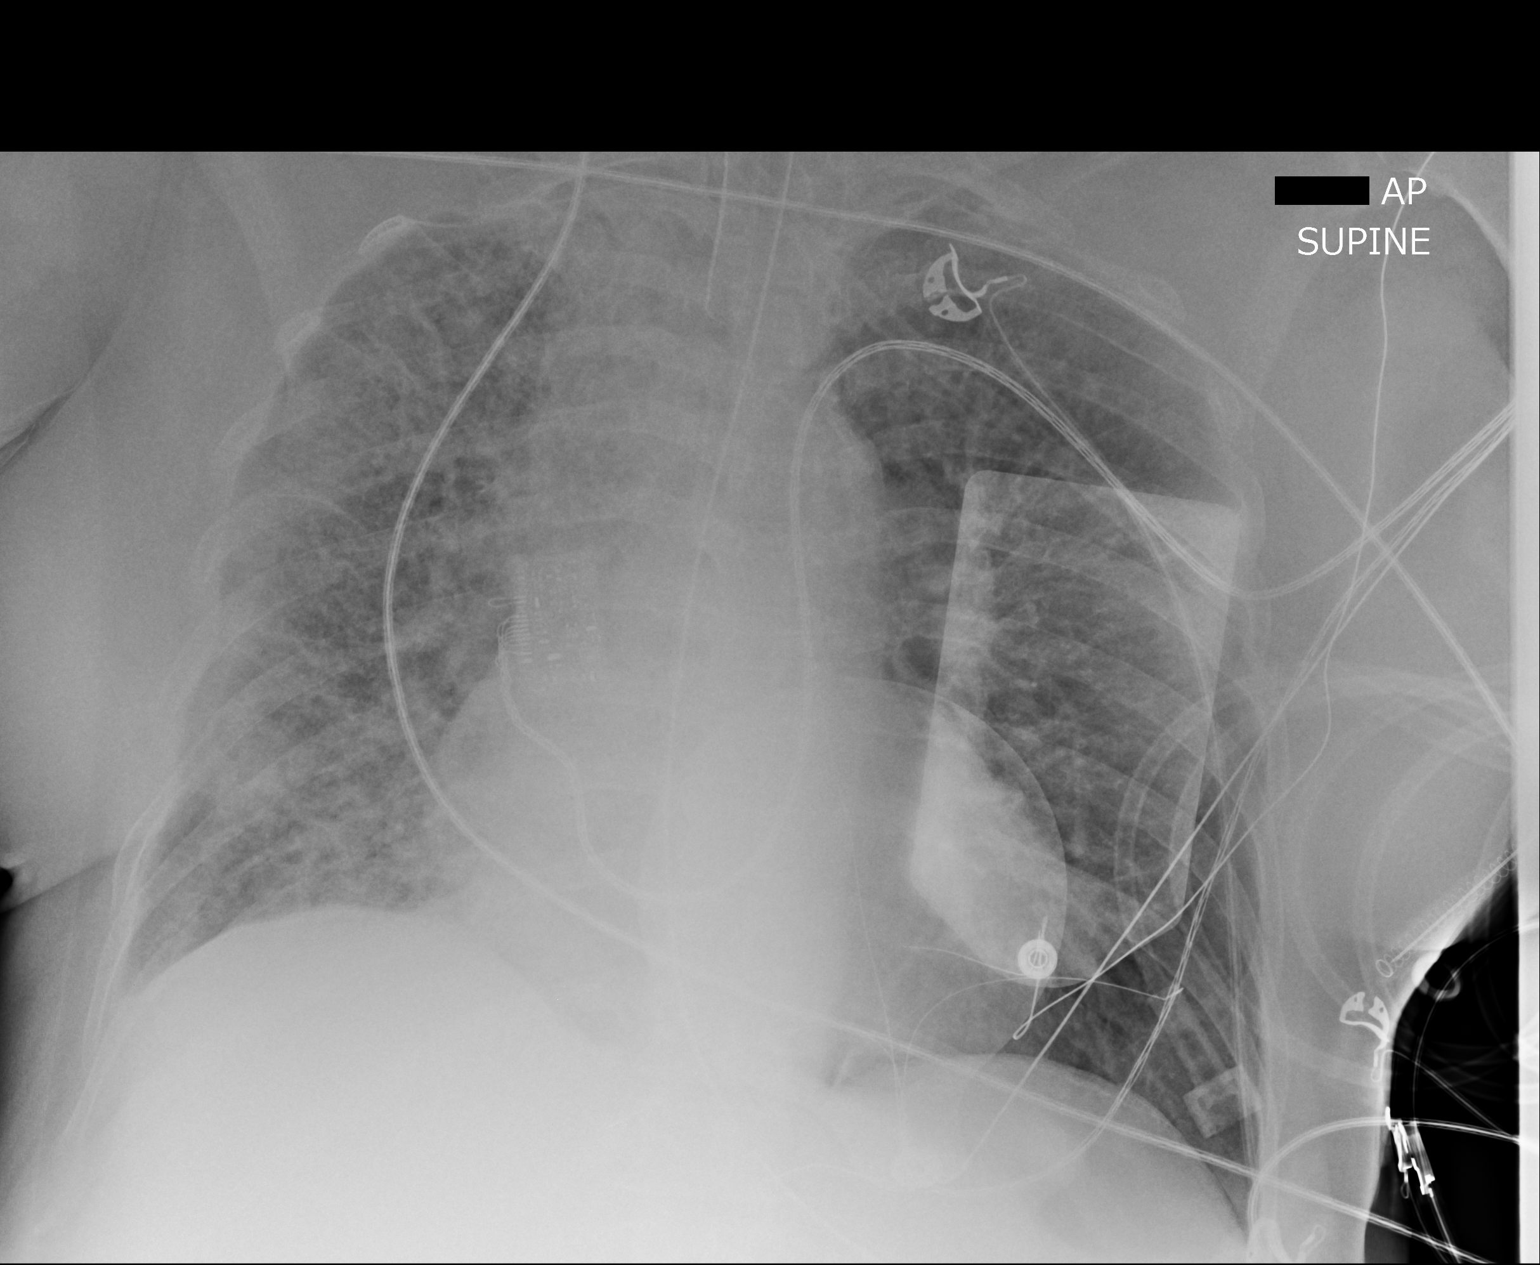

[1 of 1 positions shown; findings below may reference images not displayed]

FINDINGS: Tip of endotracheal tube projects 4.7 cm above carinal.

Nasogastric tube extends into stomach.

EKG leads and external pacing leads present.

Upper normal size of cardiac silhouette.

Diffuse interstitial infiltrates greater on right, question edema or
less likely infection.

No gross pleural effusion or pneumothorax.
IMPRESSION: Tube positions as above.

Bilateral infiltrates greater on right, favor edema over infection.
